# Patient Record
Sex: Male | Born: 1940 | Race: Black or African American | Hispanic: No | Smoking: Never smoker
Health system: Southern US, Community
[De-identification: ages and names within clinical notes are randomized; demographics above are authoritative.]

## PROBLEM LIST (undated history)

## (undated) DIAGNOSIS — R7611 Nonspecific reaction to tuberculin skin test without active tuberculosis: Secondary | ICD-10-CM

## (undated) DIAGNOSIS — E785 Hyperlipidemia, unspecified: Secondary | ICD-10-CM

## (undated) DIAGNOSIS — I10 Essential (primary) hypertension: Secondary | ICD-10-CM

## (undated) HISTORY — DX: Essential (primary) hypertension: I10

## (undated) HISTORY — DX: Hyperlipidemia, unspecified: E78.5

## (undated) HISTORY — DX: Nonspecific reaction to tuberculin skin test without active tuberculosis: R76.11

---

## 2004-06-15 ENCOUNTER — Ambulatory Visit: Payer: Self-pay | Admitting: Internal Medicine

## 2004-06-15 ENCOUNTER — Encounter (INDEPENDENT_AMBULATORY_CARE_PROVIDER_SITE_OTHER): Payer: Self-pay | Admitting: Internal Medicine

## 2004-06-18 ENCOUNTER — Ambulatory Visit (HOSPITAL_COMMUNITY): Admission: RE | Admit: 2004-06-18 | Discharge: 2004-06-18 | Payer: Self-pay | Admitting: Internal Medicine

## 2004-06-18 ENCOUNTER — Ambulatory Visit: Payer: Self-pay | Admitting: Internal Medicine

## 2005-10-01 ENCOUNTER — Ambulatory Visit: Payer: Self-pay | Admitting: Hospitalist

## 2005-10-01 ENCOUNTER — Ambulatory Visit (HOSPITAL_COMMUNITY): Admission: RE | Admit: 2005-10-01 | Discharge: 2005-10-01 | Payer: Self-pay | Admitting: Hospitalist

## 2005-10-04 ENCOUNTER — Ambulatory Visit: Payer: Self-pay | Admitting: Internal Medicine

## 2005-10-08 DIAGNOSIS — M109 Gout, unspecified: Secondary | ICD-10-CM | POA: Insufficient documentation

## 2005-10-11 ENCOUNTER — Ambulatory Visit: Payer: Self-pay | Admitting: Internal Medicine

## 2005-12-16 ENCOUNTER — Ambulatory Visit (HOSPITAL_COMMUNITY): Admission: RE | Admit: 2005-12-16 | Discharge: 2005-12-16 | Payer: Self-pay | Admitting: Internal Medicine

## 2005-12-16 ENCOUNTER — Ambulatory Visit: Payer: Self-pay | Admitting: Internal Medicine

## 2005-12-17 ENCOUNTER — Ambulatory Visit: Payer: Self-pay | Admitting: Internal Medicine

## 2006-01-08 ENCOUNTER — Ambulatory Visit: Payer: Self-pay | Admitting: Internal Medicine

## 2006-07-22 DIAGNOSIS — E785 Hyperlipidemia, unspecified: Secondary | ICD-10-CM | POA: Insufficient documentation

## 2006-07-22 DIAGNOSIS — I10 Essential (primary) hypertension: Secondary | ICD-10-CM | POA: Insufficient documentation

## 2006-11-18 ENCOUNTER — Telehealth (INDEPENDENT_AMBULATORY_CARE_PROVIDER_SITE_OTHER): Payer: Self-pay | Admitting: *Deleted

## 2006-11-25 ENCOUNTER — Telehealth (INDEPENDENT_AMBULATORY_CARE_PROVIDER_SITE_OTHER): Payer: Self-pay | Admitting: *Deleted

## 2008-03-16 ENCOUNTER — Encounter: Payer: Self-pay | Admitting: Internal Medicine

## 2008-03-16 ENCOUNTER — Ambulatory Visit: Payer: Self-pay | Admitting: Internal Medicine

## 2008-03-17 LAB — CONVERTED CEMR LAB
ALT: 26 units/L (ref 0–53)
AST: 22 units/L (ref 0–37)
BUN: 19 mg/dL (ref 6–23)
CO2: 27 meq/L (ref 19–32)
Calcium: 9.4 mg/dL (ref 8.4–10.5)
Chloride: 105 meq/L (ref 96–112)
Cholesterol: 239 mg/dL — ABNORMAL HIGH (ref 0–200)
Glucose, Bld: 93 mg/dL (ref 70–99)
LDL Cholesterol: 161 mg/dL — ABNORMAL HIGH (ref 0–99)
Potassium: 4.9 meq/L (ref 3.5–5.3)
Total Bilirubin: 0.4 mg/dL (ref 0.3–1.2)
Total Protein: 7.6 g/dL (ref 6.0–8.3)
VLDL: 32 mg/dL (ref 0–40)

## 2008-03-21 ENCOUNTER — Telehealth: Payer: Self-pay | Admitting: *Deleted

## 2009-03-21 ENCOUNTER — Telehealth: Payer: Self-pay | Admitting: Internal Medicine

## 2009-03-23 ENCOUNTER — Telehealth: Payer: Self-pay | Admitting: *Deleted

## 2009-06-02 ENCOUNTER — Telehealth: Payer: Self-pay | Admitting: Infectious Diseases

## 2009-06-02 ENCOUNTER — Encounter: Payer: Self-pay | Admitting: Internal Medicine

## 2009-06-02 ENCOUNTER — Ambulatory Visit: Payer: Self-pay | Admitting: Infectious Diseases

## 2009-06-05 LAB — CONVERTED CEMR LAB
ALT: 23 units/L (ref 0–53)
Alkaline Phosphatase: 59 units/L (ref 39–117)
BUN: 17 mg/dL (ref 6–23)
CO2: 26 meq/L (ref 19–32)
Chloride: 103 meq/L (ref 96–112)
Glucose, Bld: 88 mg/dL (ref 70–99)
LDL Cholesterol: 127 mg/dL — ABNORMAL HIGH (ref 0–99)
MCHC: 32.5 g/dL (ref 30.0–36.0)
MCV: 88.9 fL (ref >=78.0)
Platelets: 196 10*3/uL (ref 150–400)
Potassium: 4.3 meq/L (ref 3.5–5.3)
RBC: 4.6 M/uL (ref 4.22–5.81)
RDW: 13.4 % (ref 11.5–15.5)
Total Bilirubin: 0.4 mg/dL (ref 0.3–1.2)
Total CHOL/HDL Ratio: 4.6
Total Protein: 7.3 g/dL (ref 6.0–8.3)
WBC: 6.5 10*3/uL (ref 4.0–10.5)

## 2010-07-17 ENCOUNTER — Telehealth: Payer: Self-pay | Admitting: Internal Medicine

## 2010-09-05 ENCOUNTER — Ambulatory Visit: Payer: Self-pay

## 2010-10-24 NOTE — Progress Notes (Signed)
Summary: phone/gg  Phone Note Call from Patient   Summary of Call: Pt came to clinic stating his boss said pt was sleeping while at work.  Pt is 68 and wants to get off nights and wants Dr Onalee Hua to write a letter saying it's better for his health to work days. Pt has scheduled appointment with Dr Onalee Hua on 12/14.  Pt wanted the letter now so he could hand it in tomorrow.  I told pt he should be examined first so the doctor could evaluated him and make a decision.   He will tell his employer about the appointment. Initial call taken by: Merrie Roof RN,  July 17, 2010 3:52 PM  Follow-up for Phone Call        I agree with him being seen.  Thanks Follow-up by: Mariea Stable MD,  July 18, 2010 8:45 AM

## 2010-11-01 ENCOUNTER — Encounter: Payer: Self-pay | Admitting: Internal Medicine

## 2010-11-01 ENCOUNTER — Encounter: Payer: Self-pay | Admitting: *Deleted

## 2010-11-01 ENCOUNTER — Ambulatory Visit (INDEPENDENT_AMBULATORY_CARE_PROVIDER_SITE_OTHER): Payer: Medicare Other | Admitting: Internal Medicine

## 2010-11-01 VITALS — BP 150/78 | HR 74 | Temp 99.5°F | Ht 68.0 in | Wt 203.0 lb

## 2010-11-01 DIAGNOSIS — Z23 Encounter for immunization: Secondary | ICD-10-CM

## 2010-11-01 DIAGNOSIS — I10 Essential (primary) hypertension: Secondary | ICD-10-CM

## 2010-11-01 DIAGNOSIS — W5501XA Bitten by cat, initial encounter: Secondary | ICD-10-CM | POA: Insufficient documentation

## 2010-11-01 DIAGNOSIS — IMO0001 Reserved for inherently not codable concepts without codable children: Secondary | ICD-10-CM

## 2010-11-01 MED ORDER — AMOXICILLIN-POT CLAVULANATE 875-125 MG PO TABS: 1.0000 | ORAL_TABLET | Freq: Two times a day (BID) | ORAL | Status: AC

## 2010-11-01 MED ORDER — HYDROCHLOROTHIAZIDE 25 MG PO TABS: 25.0000 mg | ORAL_TABLET | Freq: Every day | ORAL | Status: DC

## 2010-11-01 MED ORDER — DIPHTH-ACELL PERTUSSIS-TETANUS 15-10-5 LF-MCG/0.5 IM SUSP: 0.5000 mL | Freq: Once | INTRAMUSCULAR | Status: DC

## 2010-11-01 NOTE — Progress Notes (Signed)
  Subjective:    Patient ID: Austin Huerta, male    DOB: 1941/05/05, 70 y.o.   MRN: [id number]  HPI Pt is a 70 y/o male with h/o HTN, gout (stable), and hyperlipidemia presenting with a one day h/o cat bite. States he was bitten by a Dance movement psychotherapist that belongs to his neighbor last night. He was trying to separate 2 cats from fighting at the time. He was bitten on his right ring finger, tried to treat with Iodine and sealed the wounds with a band aid. However developed significant swelling, redness and mild pain overnight. Denied any interval fever, chills, sob, or night sweats. There also has not been any drainage from the wounds. He does not remember when he got his last tetanus shot. He states that the cat has been immunized against rabies.    Review of Systems    As per HPI above.   Past Medical History  Diagnosis Date  . Hypertension   . Hyperlipidemia   . Gout   . Positive PPD     History of     Objective:   Physical Exam  Constitutional: He is oriented to person, place, and time. He appears well-developed and well-nourished. No distress.  Cardiovascular: Normal rate, regular rhythm and normal heart sounds.  Exam reveals no gallop and no friction rub.   No murmur heard. Pulmonary/Chest: Effort normal and breath sounds normal. No respiratory distress. He has no wheezes. He has no rales.  Abdominal: Soft. Bowel sounds are normal. There is no tenderness.  Musculoskeletal:       Hands:      Right hand - ROM only mildly limited by mild pain and swelling throughout wrist and finger joints.  Neurological: He is alert and oriented to person, place, and time.  Psychiatric: He has a normal mood and affect.      Assessment & Plan:

## 2010-11-01 NOTE — Progress Notes (Signed)
Addended by: Angelina Ok on: 11/01/2010 02:08 PM   Modules accepted: Orders

## 2010-11-01 NOTE — Patient Instructions (Signed)
Please take your antibiotic consistently for the next 7 days. If your symptoms worsen in the next 1-2 days, please go to your nearest ER. Go to your nearest ER if you develop any fever to 101/higher, chills, night sweats or worsening pain. Call the clinic if you have any questions or concerns.     Animal Bites   Animal bites have a high rate of infection, so they require thorough cleaning. Animal bites may become infected in spite of good cleaning. The teeth carry germs (bacteria) below the skin surface. Bites may require tetanus and rabies vaccines. The animal should be confined if possible for ten days to make sure there is no development of rabies. Medications which kill germs (antibiotics) may be required.   YOU MIGHT NEED A TETANUS SHOT NOW IF:  You have no idea when you had the last one.  You have never had a tetanus shot before.  Your wound had dirt in it.  If you need a tetanus shot, and you choose not to get one, there is a rare chance of getting tetanus. Sickness from tetanus can be serious.    HOME CARE INSTRUCTIONS  You may need to report the event to animal control or your local police.  Keep wound clean, dry and dressed as suggested by your caregiver.  Keep the injured part elevated as much as possible.  Do not resume use of the affected area until directed.  If antibiotics were prescribed, take them as directed and finish the prescription.  Only take over-the-counter or prescription medicines for pain, discomfort, or fever as directed by your caregiver.    SEEK MEDICAL CARE IF:  You develop a fever above 101 F (38.9 C).   The wound becomes more red and sore (inflamed) and swollen, or has a pus-like (purulent) discharge.  Your pain is not controlled with medication.   SEEK IMMEDIATE MEDICAL CARE IF:  You develop nausea or vomiting.  You have chills or a high temperature.  If pain prevents joint from movement.   MAKE SURE YOU:   Understand these  instructions.   Will watch your condition.  Will get help right away if you are not doing well or get worse.   Document Released: 09/09/2005  Document Re-Released: 10/01/2009 [facility] Patient Information 2011 [place], [place].

## 2010-11-01 NOTE — Progress Notes (Unsigned)
Pt walked into clinic with c/o cat bite to rt finger yesterday.  Today entire hand is swollen and red. Denies fever.  Taking advil for pain.    Talked with Dr Aundria Rud and pt added to schedule for today.

## 2010-11-01 NOTE — Assessment & Plan Note (Addendum)
Not optimally controlled. On HCTZ 25mg  daily. Hasn't been to the clinic in over a year. Has appt. with PCP in April, at which time med doses can be adjusted and all his chronic issues can be addressed. For today, refill script.

## 2010-11-01 NOTE — Assessment & Plan Note (Signed)
Has 2 puncture-like wounds on right ring finger with surrounding edema and erythema. Deeper infection not suspected at this time. ROM limited by swelling, no paresthesia. Afebrile as well. Will rx with Augmentin x 7 days. Tetanus shot given (unknown immunization status). Instructed to keep the wounds clean, dry and aerated.  To return in the next 4 days for reassessment, but instructed to report any worsening pain, swelling, development of fever or chills to the nearest ER.

## 2010-11-01 NOTE — Progress Notes (Signed)
Addended by: Angelina Ok on: 11/01/2010 01:53 PM   Modules accepted: Orders

## 2010-11-05 ENCOUNTER — Ambulatory Visit (INDEPENDENT_AMBULATORY_CARE_PROVIDER_SITE_OTHER): Payer: Medicare Other | Admitting: Ophthalmology

## 2010-11-05 ENCOUNTER — Encounter: Payer: Self-pay | Admitting: Ophthalmology

## 2010-11-05 DIAGNOSIS — I10 Essential (primary) hypertension: Secondary | ICD-10-CM

## 2010-11-05 DIAGNOSIS — IMO0001 Reserved for inherently not codable concepts without codable children: Secondary | ICD-10-CM

## 2010-11-05 DIAGNOSIS — W5501XA Bitten by cat, initial encounter: Secondary | ICD-10-CM

## 2010-11-05 NOTE — Assessment & Plan Note (Addendum)
The patient's blood pressure was elevated today, he is currently on 25 mg of HCTZ but he has not been taking this regularly. I encouraged the patient to take his blood pressure medication daily so that we can decide whether or not another agent will be necessary. If the patient's blood pressure is still elevated at his next visit, I would at least consider addition of a another medication though it may be appropriate to continue him on his current  regimen until he follows up with his PCP in April.

## 2010-11-05 NOTE — Progress Notes (Signed)
  Subjective:    Patient ID: Austin Huerta, male    DOB: Feb 14, 1941, 70 y.o.   MRN: [id number]  HPI   This is a 70 year old male with a past medical history significant for hypertension who was last seen on November 01, 2010 due to a one-day history of cat bite on the 4th finger of his right hand.  The bite did have surrounding erythema and swelling at that time leading to mildly decreased range of motion. Since the patient had no fever or systemic symptoms a deep wound infection was not felt to be likely.   Because of his unknown status, a tetanus vaccination was given, and the patient was treated with 7 days of Augmentin. Since that time,  the patient's redness has decreased, though he has had increased edema of the hand to a level of approximately 1-1/2 inches beyond the wrist. The patient does feel that this is starting to improve somewhat  and was at its worst on Saturday. The patient has not kept his hand elevated and also states that he divided his dose of augmentin in half for the past day. The patient denies any fevers chills or any constitutional symptoms. The patient also states that the pain in the hand is improving.   Review of Systems  Constitutional: Negative for fever and chills.  Respiratory: Negative for cough and shortness of breath.   Cardiovascular: Negative for chest pain and palpitations.  Gastrointestinal: Negative for vomiting, diarrhea and constipation.       Objective:   Physical Exam  Constitutional: He appears well-developed and well-nourished.  HENT:  Head: Normocephalic and atraumatic.  Eyes: Pupils are equal, round, and reactive to light.  Cardiovascular: Normal rate, regular rhythm and intact distal pulses.  Exam reveals no gallop and no friction rub.   No murmur heard. Pulmonary/Chest: Effort normal and breath sounds normal. He has no wheezes. He has no rales.  Abdominal: Soft. Bowel sounds are normal. He exhibits no distension. There is no tenderness.    Musculoskeletal: Normal range of motion.       Right hand has 2+ edema to a level slightly beyond the wrist.  The two puncture wounds are CDI without surrounding edema. The pt has 2+ radial puleses, and no lymphangitis.  There is mild tenderness over the tendons of the wrist, but this is improved per patient report. There is no lymphadenopathy in the right arm.  Neurological: He is alert. No cranial nerve deficit.  Skin: No rash noted.       Current Outpatient Prescriptions on File Prior to Visit  Medication Sig Dispense Refill  . amoxicillin-clavulanate (AUGMENTIN) 875-125 MG per tablet Take 1 tablet by mouth 2 (two) times daily.  14 tablet  0  . atorvastatin (LIPITOR) 20 MG tablet Take 20 mg by mouth daily.        . hydrochlorothiazide 25 MG tablet Take 1 tablet (25 mg total) by mouth daily.  60 tablet  3     Past Medical History  Diagnosis Date  . Hypertension   . Hyperlipidemia   . Gout   . Positive PPD     History of      Assessment & Plan:

## 2010-11-05 NOTE — Assessment & Plan Note (Addendum)
At this point in time, the patient hand does not appear to be infected it's  Very edematous. There is no lymphangitis and no significant temperature difference between the 2 hands. At this point in time, I will continue the patient on his course of antibiotics with the plan to have him followup in 3 days to ensure improvement in his symptoms. I instructed the patient to perform daily hand soaks, to continue working on range of motion, and to maintain an elevated position of the hand try improve the edema. If the patient does not have significant improvement in his symptoms in 3 days, I would recommend the addition of doxycycline to his antibiotic regimen, to cover  Secondary infection with MRSA. Referral to hand specialist should also be considered at that time if necessary.  I did tell the patient it is very important that he take full tablets twice daily.

## 2010-11-05 NOTE — Patient Instructions (Addendum)
Please call the clinic or go to the ED if you have any worsening of your hand symptoms , or develop any fevers chills nausea or vomiting or other concerning symptoms. You will need to followup here in the clinic in 2-3 days to ensure improvement in your symptoms.

## 2010-11-08 ENCOUNTER — Encounter: Payer: Self-pay | Admitting: Internal Medicine

## 2010-11-08 ENCOUNTER — Ambulatory Visit (INDEPENDENT_AMBULATORY_CARE_PROVIDER_SITE_OTHER): Payer: Medicare Other | Admitting: Internal Medicine

## 2010-11-08 VITALS — BP 137/75 | HR 78 | Temp 97.4°F | Ht 68.0 in | Wt 194.2 lb

## 2010-11-08 DIAGNOSIS — W5501XA Bitten by cat, initial encounter: Secondary | ICD-10-CM

## 2010-11-08 DIAGNOSIS — I10 Essential (primary) hypertension: Secondary | ICD-10-CM

## 2010-11-08 DIAGNOSIS — IMO0001 Reserved for inherently not codable concepts without codable children: Secondary | ICD-10-CM

## 2010-11-08 NOTE — Assessment & Plan Note (Signed)
Edema and swelling improved, continue abx, follow up if swelling resumes or worsens Shown gentle flexion exercises for hand to prevent contracture

## 2010-11-08 NOTE — Progress Notes (Signed)
  Subjective:    Patient ID: Austin Huerta, male    DOB: 03/19/1941, 70 y.o.   MRN: [id number]  HPI Wound check Has been taking Abx without difficulty   Review of Systems Neg    Objective:   Physical Exam    Hand swelling improved.  He has some discomfort with flexion of hand and fingers but able to do     Assessment & Plan:

## 2010-11-09 LAB — BASIC METABOLIC PANEL
BUN: 25 mg/dL — ABNORMAL HIGH (ref 6–23)
CO2: 27 mEq/L (ref 19–32)
Calcium: 9.1 mg/dL (ref 8.4–10.5)
Chloride: 99 mEq/L (ref 96–112)
Creat: 1.08 mg/dL (ref 0.40–1.50)
Glucose, Bld: 112 mg/dL — ABNORMAL HIGH (ref 70–99)
Potassium: 4.1 mEq/L (ref 3.5–5.3)
Sodium: 137 mEq/L (ref 135–145)

## 2011-01-09 ENCOUNTER — Encounter: Payer: Self-pay | Admitting: Internal Medicine

## 2011-03-20 ENCOUNTER — Ambulatory Visit: Payer: Medicare Other | Admitting: Internal Medicine

## 2011-03-25 ENCOUNTER — Ambulatory Visit: Payer: Medicare Other | Admitting: Internal Medicine

## 2011-04-29 ENCOUNTER — Telehealth: Payer: Self-pay | Admitting: *Deleted

## 2011-04-29 ENCOUNTER — Encounter: Payer: Self-pay | Admitting: Internal Medicine

## 2011-04-29 ENCOUNTER — Ambulatory Visit (HOSPITAL_COMMUNITY)
Admission: RE | Admit: 2011-04-29 | Discharge: 2011-04-29 | Disposition: A | Discharge status: Home / Self Care | Payer: Medicare Other | Source: Ambulatory Visit | Attending: Internal Medicine | Admitting: Internal Medicine

## 2011-04-29 ENCOUNTER — Ambulatory Visit (INDEPENDENT_AMBULATORY_CARE_PROVIDER_SITE_OTHER): Payer: Medicare Other | Admitting: Internal Medicine

## 2011-04-29 VITALS — BP 146/82 | HR 96 | Temp 98.0°F | Ht 68.0 in | Wt 204.1 lb

## 2011-04-29 DIAGNOSIS — M25569 Pain in unspecified knee: Secondary | ICD-10-CM

## 2011-04-29 DIAGNOSIS — E785 Hyperlipidemia, unspecified: Secondary | ICD-10-CM

## 2011-04-29 DIAGNOSIS — I10 Essential (primary) hypertension: Secondary | ICD-10-CM

## 2011-04-29 DIAGNOSIS — M25561 Pain in right knee: Secondary | ICD-10-CM

## 2011-04-29 DIAGNOSIS — I2510 Atherosclerotic heart disease of native coronary artery without angina pectoris: Secondary | ICD-10-CM

## 2011-04-29 DIAGNOSIS — M25469 Effusion, unspecified knee: Secondary | ICD-10-CM | POA: Insufficient documentation

## 2011-04-29 LAB — BASIC METABOLIC PANEL
CO2: 29 mEq/L (ref 19–32)
Potassium: 4 mEq/L (ref 3.5–5.3)

## 2011-04-29 LAB — URIC ACID: Uric Acid, Serum: 7 mg/dL (ref 4.0–7.8)

## 2011-04-29 MED ORDER — PRAVASTATIN SODIUM 40 MG PO TABS: 40.0000 mg | ORAL_TABLET | Freq: Every evening | ORAL | Status: DC

## 2011-04-29 MED ORDER — HYDROCHLOROTHIAZIDE 25 MG PO TABS: 25.0000 mg | ORAL_TABLET | Freq: Every day | ORAL | Status: DC

## 2011-04-29 MED ORDER — ASPIRIN EC 81 MG PO TBEC: 81.0000 mg | DELAYED_RELEASE_TABLET | Freq: Every day | ORAL | Status: DC

## 2011-04-29 MED ORDER — PREDNISONE (PAK) 10 MG PO TABS: 10.0000 mg | ORAL_TABLET | Freq: Every day | ORAL | Status: AC

## 2011-04-29 NOTE — Assessment & Plan Note (Signed)
Patient was supposed to be on Lipitor however he has not been taking anything for his cholesterol recently, I started him on pravastatin after discussion about his cholesterol, and she agrees with this plan, at next followup patient should have a repeat fasting lipid panel along with liver function tests.

## 2011-04-29 NOTE — Progress Notes (Signed)
  Subjective:    Patient ID: Austin Huerta, male    DOB: November 13, 1940, 70 y.o.   MRN: [id number]  HPI  She is a 70 year old male, with a past medical history significant for gout which patient reports was diagnosed here 5 years ago via an right MTP joint tap. Patient reports it has been helping some friends and neighbors move furniture recently, does not recall any injury however now he is experiencing right knee swelling and pain for the past few days. Patient reports that this is similar to episodes of gout that he had in the past, denies fever chills nausea vomiting, or any other complaints. Reports that he is noncompliant with his medications. He only takes hydrochlorothiazide when he remembers.   Review of Systems  Musculoskeletal: Positive for joint swelling.  All other systems reviewed and are negative.       Objective:   Physical Exam  Constitutional: He is oriented to person, place, and time. He appears well-developed and well-nourished.  HENT:  Head: Normocephalic and atraumatic.  Eyes: Pupils are equal, round, and reactive to light.  Neck: Normal range of motion. No JVD present. No thyromegaly present.  Cardiovascular: Normal rate, regular rhythm and normal heart sounds.   Pulmonary/Chest: Effort normal and breath sounds normal. He has no wheezes. He has no rales.  Abdominal: Soft. Bowel sounds are normal. There is no tenderness. There is no rebound.  Musculoskeletal: Normal range of motion. He exhibits no edema.       Right knee: He exhibits swelling. He exhibits no effusion, no deformity, no laceration, no erythema, normal alignment, no LCL laxity, normal patellar mobility, normal meniscus and no MCL laxity. tenderness found. No MCL and no LCL tenderness noted.       Legs: Neurological: He is alert and oriented to person, place, and time.  Skin: Skin is warm and dry.          Assessment & Plan:

## 2011-04-29 NOTE — Assessment & Plan Note (Signed)
Patient is asymptomatic however given the patient's age and risk factors, his pretest probability for CAD is 99%, given this I will start him on a baby aspirin daily

## 2011-04-29 NOTE — Assessment & Plan Note (Signed)
Patient's blood pressure slightly elevated, however he has not been taking his medication as prescribed, I have given him a prescription for hydrochlorothiazide today, I stressed the importance of medication compliance.

## 2011-04-29 NOTE — Telephone Encounter (Signed)
Pt called with c/o knee pain. Onset 1 week ago.  He moved furniture, did lifting for several days. Now muscles on rt knee got stiff and it resolved into swelling.  Hard to stand, using cane to ambulate.  He has taken IBU 600 mg a day with some relief. Hx: gout   Will see today

## 2011-04-29 NOTE — Assessment & Plan Note (Signed)
Given the patient's history of recent exertion and heavy lifting while moving furniture, this may be traumatic, but the patient does report a history of gout which may have contributed to his right knee pain. I will order a two-view x-ray of his knee today and check a uric acid level and BMET, for now advised patient to take nonsteroidal anti-inflammatories short-term for relief. If the pain and swelling does not subside, I advised the patient to return for possible joint aspiration for further evaluation and treatment.

## 2011-05-13 ENCOUNTER — Ambulatory Visit (INDEPENDENT_AMBULATORY_CARE_PROVIDER_SITE_OTHER): Payer: Medicare Other | Admitting: Internal Medicine

## 2011-05-13 ENCOUNTER — Encounter: Payer: Self-pay | Admitting: Internal Medicine

## 2011-05-13 DIAGNOSIS — M25561 Pain in right knee: Secondary | ICD-10-CM

## 2011-05-13 DIAGNOSIS — R7611 Nonspecific reaction to tuberculin skin test without active tuberculosis: Secondary | ICD-10-CM

## 2011-05-13 DIAGNOSIS — I2510 Atherosclerotic heart disease of native coronary artery without angina pectoris: Secondary | ICD-10-CM

## 2011-05-13 DIAGNOSIS — E785 Hyperlipidemia, unspecified: Secondary | ICD-10-CM

## 2011-05-13 DIAGNOSIS — M25569 Pain in unspecified knee: Secondary | ICD-10-CM

## 2011-05-13 DIAGNOSIS — I10 Essential (primary) hypertension: Secondary | ICD-10-CM

## 2011-05-13 LAB — LIPID PANEL
HDL: 44 mg/dL (ref >39)
LDL Cholesterol: 116 mg/dL — ABNORMAL HIGH (ref 0–99)
Total CHOL/HDL Ratio: 4.2 Ratio
Triglycerides: 120 mg/dL (ref <150)

## 2011-05-13 MED ORDER — ASPIRIN EC 81 MG PO TBEC: 81.0000 mg | DELAYED_RELEASE_TABLET | Freq: Every day | ORAL | Status: DC

## 2011-05-13 MED ORDER — PRAVASTATIN SODIUM 40 MG PO TABS: 40.0000 mg | ORAL_TABLET | Freq: Every evening | ORAL | Status: DC

## 2011-05-13 NOTE — Assessment & Plan Note (Signed)
Overuse/trauma vs. Gout.  No signs of active inflammation today.  No pain.   Foot swelling likely dependent swelling from the knee.  Stop ibuprofen- Take one 200mg  pill TID for two-three days then stop. Stop using crutches RTC if pain returns or swelling worsens.

## 2011-05-13 NOTE — Progress Notes (Signed)
  Subjective:    Patient ID: Austin Huerta, male    DOB: Feb 08, 1941, 70 y.o.   MRN: [id number]  HPI Austin Huerta is a 70 year old man with a history of HTN, HLD, and Gout who presents for follow-up after being seen by Dr. Gilford Rile on 04/29/11 for R knee pain.   His knee pain was thought to be either related to trauma/overuse since he had been lifting boxes for three days beforehand (but no traumatic event in particular) or gout (history of gout).  He was advised to take 600mg  of ibuprofen TID, which he is still taking now.  He also has been using crutches, which he is still using even though the pain resolved a week ago after lasting a week.  He believes that the swelling remains, perhaps a bit better than at last visit.    He has HTN and was treated with HCTZ.  He says that he has been taking this regularly without missing doses. No headaches, CP, SOB.  He has a history of gout with only one definite flare five years ago when he had pain the same knee and a arthrocentesis showing gout.    At baseline, he can walk at least a mile and can climb at least 3-4 flights of stairs.   He does not exercise regularly. No smoking, no drug use.  No alcohol use.  Review of Systems ROS: General: no fevers, chills, changes in weight, changes in appetite Skin: no rash HEENT: no blurry vision, hearing changes, sore throat Pulm: no dyspnea, coughing, wheezing CV: no chest pain, palpitations, shortness of breath Abd: no abdominal pain, nausea/vomiting, diarrhea/constipation GU: no dysuria, hematuria, polyuria Ext: see HPI Neuro: no weakness, numbness, or tingling.  No headaches.     Objective:  Physical Exam Filed Vitals:   05/13/11 1335  BP: 155/79  Pulse: 57  Temp: 97.1 F (36.2 C)   BP recheck: 164/78  General: alert, well-developed, and cooperative to examination.  Head: normocephalic and atraumatic.  Eyes: vision grossly intact, pupils equal, pupils round, pupils reactive to light, no injection  and anicteric.  Mouth: pharynx pink and moist, no erythema, and no exudates.  Neck: supple, full ROM, no JVD, and no carotid bruits.  Lungs: normal respiratory effort, no accessory muscle use, normal breath sounds, no crackles, and no wheezes. Heart: normal rate, regular rhythm, no murmur, no gallop, and no rub.  Msk: no joint swelling, no joint warmth, and no redness over joints.  Pulses: 2+ DP/PT pulses bilaterally  Extremities: No cyanosis, clubbing, edema.  R knee is slightly larger than L.  No tenderness to palpation in either knee. R knee: No tenderness to palpation, no pain with flexion/extension.  No pain with varus or valgus stress.  No baker's cyst palpation, no abnormal popliteal fullness.  Anterior drawer negative.  No palpation effusion.    R foot: Mild swelling over dorsum of foot into proximal toes.  Foot entirely non-tender to palpation.  Sensation intact in each toe.   Neurologic: alert & oriented X3, cranial nerves II-XII intact, strength normal in all extremities, sensation intact to light touch. Skin: turgor normal and no rashes.  Psych: Oriented X3, memory intact for recent and remote, normally interactive, good eye contact, not anxious appearing, and not depressed appearing.   Assessment & Plan:

## 2011-05-13 NOTE — Patient Instructions (Signed)
Please return to clinic in one month

## 2011-05-13 NOTE — Assessment & Plan Note (Addendum)
Never filled his pravastatin prescription because afraid of side effects.  Explained to him how we will monitor for LFTs and that he should stop taking if he notices significant muscle pain.  Otherwise reassured him.  Will get CMET and FLP today since he is fasting.  Will give him another script for pravastatin.    His Framingham score gives him 37% 10 year risk of CHD.  As a result, his LDL goal is 100.

## 2011-05-13 NOTE — Assessment & Plan Note (Signed)
Pressure high today, worse on recheck.  Reports that he has been taking his HCTZ regularly.  Asymptomatic.  This may be influenced by the high doses of ibuprofen he has been taking.  Will stop ibuprofen and follow-up in one month for re-evaluation.

## 2011-05-13 NOTE — Progress Notes (Deleted)
  Subjective:    Patient ID: Austin Huerta, male    DOB: 06/29/1941, 69 y.o.   MRN: [id number]  HPI    Review of Systems     Objective:   Physical Exam        Assessment & Plan:   

## 2011-05-14 LAB — COMPLETE METABOLIC PANEL WITH GFR
ALT: 15 U/L (ref 0–53)
Albumin: 3.8 g/dL (ref 3.5–5.2)
Alkaline Phosphatase: 63 U/L (ref 39–117)
CO2: 31 mEq/L (ref 19–32)
Calcium: 9.4 mg/dL (ref 8.4–10.5)
GFR, Est African American: >60 mL/min (ref >60)
Glucose, Bld: 87 mg/dL (ref 70–99)
Potassium: 4.1 mEq/L (ref 3.5–5.3)
Total Bilirubin: 0.3 mg/dL (ref 0.3–1.2)

## 2011-05-14 NOTE — Progress Notes (Signed)
i agree with assessment and plan as per Dr. Larey Seat

## 2011-05-20 ENCOUNTER — Encounter: Payer: Medicare Other | Admitting: Internal Medicine

## 2011-06-13 ENCOUNTER — Ambulatory Visit (INDEPENDENT_AMBULATORY_CARE_PROVIDER_SITE_OTHER): Payer: Medicare Other | Admitting: Internal Medicine

## 2011-06-13 ENCOUNTER — Encounter: Payer: Self-pay | Admitting: Internal Medicine

## 2011-06-13 VITALS — BP 160/81 | HR 71 | Temp 97.6°F | Ht 68.0 in | Wt 206.0 lb

## 2011-06-13 DIAGNOSIS — I10 Essential (primary) hypertension: Secondary | ICD-10-CM

## 2011-06-13 DIAGNOSIS — Z0000 Encounter for general adult medical examination without abnormal findings: Secondary | ICD-10-CM | POA: Insufficient documentation

## 2011-06-13 DIAGNOSIS — M109 Gout, unspecified: Secondary | ICD-10-CM

## 2011-06-13 DIAGNOSIS — E785 Hyperlipidemia, unspecified: Secondary | ICD-10-CM

## 2011-06-13 MED ORDER — LISINOPRIL-HYDROCHLOROTHIAZIDE 10-12.5 MG PO TABS: 1.0000 | ORAL_TABLET | Freq: Every day | ORAL | Status: DC

## 2011-06-13 NOTE — Assessment & Plan Note (Signed)
-  the following vaccines/interventions were discussed at length, but declined by the patient: Flu shot, pneumovax, colonoscopy, FOBT

## 2011-06-13 NOTE — Assessment & Plan Note (Signed)
The patient presents for a follow-up of R knee pain, likely caused by gout vs msk pain.  The patient's pain has resolved, with a resulting mild joint effusion, but he continues to use a crutch and ibuprofen.  The patient appears anxious during the interview, and I believe his continued crutch and NSAID use is likely due to anxiety, rather than to knee pain (which the patient admits has resolved) -the patient was encouraged to resume normal daily activities, including walking without a crutch -the patient was encouraged to stop using NSAID's, and was counseled on the potential deleterious effects of NSAID's on the GI and Renal systems

## 2011-06-13 NOTE — Progress Notes (Signed)
HPI The patient is a 70 yo man, history of HL, gout, and HTN, presenting for a 19-month follow-up for right knee pain.  The patient has a history of gout, with 1 prior episode 5 years ago, occurring in his right knee, diagnosed by arthrocentesis.  The patient presented 6 weeks ago with right knee pain, thought to be another episode of gout vs musculoskeletal pain after moving some heavy boxes.  He was seen again 4 weeks ago, and his pain had resolved with ibuprofen, and he was left with just some residual swelling, likely representing mild joint effusion.  At that time, he was advised to stop taking ibuprofen, and to stop using crutches.  Since then, the patient continues to report resolution of pain, still with persistent right knee swelling.  The patient has continued to take ibuprofen to "prevent the pain from returning".  He has also continued to use a crutch or a cane to ambulate, though he notes that he could walk all day without the aid, but that he prefers to use the crutch.  The patient was also started on Pravastatin at his last visit, but admits that he didn't fill the prescription until 7 days ago.  The patient also has a history of HTN, currently treated with only HCTZ.  His BP was elevated at his last visit, and this was thought to be possibly due to ibuprofen use.  ROS: General: no fevers, chills, changes in weight, changes in appetite Skin: no rash HEENT: no blurry vision, hearing changes, sore throat Pulm: no dyspnea, coughing, wheezing CV: no chest pain, palpitations, shortness of breath Abd: no abdominal pain, nausea/vomiting, diarrhea/constipation GU: no dysuria, hematuria, polyuria Ext: no arthralgias, myalgias Neuro: no weakness, numbness, or tingling  Filed Vitals:   06/13/11 1041  BP: 160/81  Pulse: 71  Temp: 97.6 F (36.4 C)    PEX General: alert, cooperative, and in no apparent distress HEENT: pupils equal round and reactive to light, vision grossly intact,  oropharynx clear and non-erythematous  Neck: supple, no lymphadenopathy, JVD, or carotid bruits Lungs: clear to ascultation bilaterally, normal work of respiration, no wheezes, rales, ronchi Heart: regular rate and rhythm, no murmurs, gallops, or rubs Abdomen: soft, non-tender, non-distended, normal bowel sounds Msk: Right knee with mild joint effusion present, non-painful to palpation, non-tender upon tests of ACL, PCL, menisci, and lateral ligaments, no warmth, full painless ROM Neurologic: alert & oriented X3, cranial nerves II-XII intact, strength grossly intact, sensation intact to light touch  Assessment/Plan

## 2011-06-13 NOTE — Assessment & Plan Note (Signed)
The patient has a history of hypertension, and review of records reveals poor control over multiple occasions over the past few years.  The patient is adverse to taking medications.  However, after a long conversation, he agreed to try a combination BP pill, since it would not increase the number of pills he has to take per day -patient was switched from HCTZ 25 to Lisinopril-HCTZ 10-12.5 -he will need close follow-up to ensure he has actually started taking this medication, since he has a history of medication non-compliance -the importance of BP control, and the potential complications of uncontrolled hypertension, were discussed at length with the patient -follow-up in 4-6 weeks

## 2011-06-13 NOTE — Patient Instructions (Signed)
For your blood pressure, stop taking the Hydrochlorothiazide (HCTZ) medication.  Instead, we are starting you on a combination medication called Lisinopril-Hydrochlorothiazide, which is a combination of 2 blood pressure medications in 1 pill.  Take 1 tablet daily.  For your knee, your episode of gout has resolved, and what you are experiencing is normal joint effects that happen after an episode of gout.  The best way to improve your knee is to return to your normal activities, and stop using a crutch or cane. -It is important that you also stop taking ibuprofen or similar medications.  These medications can have significant side effects if taken for long periods of time, including stomach ulcers and kidney damage.  It's okay to use these medications for short periods of time around gout flares, but not for long periods of time.  Please return for a follow-up appointment in 4-6 weeks.

## 2011-06-13 NOTE — Progress Notes (Signed)
I agree with Dr. Brown's assessment and plan. 

## 2011-06-13 NOTE — Assessment & Plan Note (Signed)
The patient was re-prescribed pravastatin at his last visit, after not filling a prior prescription for this medication.  The patient recently filled this prescription 1 week ago. -continue this medication -check cmp at next visit

## 2011-07-12 ENCOUNTER — Inpatient Hospital Stay (INDEPENDENT_AMBULATORY_CARE_PROVIDER_SITE_OTHER)
Admission: RE | Admit: 2011-07-12 | Discharge: 2011-07-12 | Disposition: A | Discharge status: Home / Self Care | Payer: Medicare Other | Source: Ambulatory Visit | Attending: Emergency Medicine | Admitting: Emergency Medicine

## 2011-07-12 DIAGNOSIS — M25469 Effusion, unspecified knee: Secondary | ICD-10-CM

## 2011-07-12 LAB — URIC ACID: Uric Acid, Serum: 7.6 mg/dL (ref 4.0–7.8)

## 2011-07-15 ENCOUNTER — Ambulatory Visit (INDEPENDENT_AMBULATORY_CARE_PROVIDER_SITE_OTHER): Payer: Medicare Other | Admitting: Internal Medicine

## 2011-07-15 ENCOUNTER — Encounter: Payer: Self-pay | Admitting: Internal Medicine

## 2011-07-15 VITALS — BP 140/78 | HR 72 | Temp 97.8°F | Ht 68.0 in | Wt 207.3 lb

## 2011-07-15 DIAGNOSIS — I10 Essential (primary) hypertension: Secondary | ICD-10-CM

## 2011-07-15 MED ORDER — ATENOLOL 25 MG PO TABS: 25.0000 mg | ORAL_TABLET | Freq: Every day | ORAL | Status: DC

## 2011-07-15 NOTE — Progress Notes (Signed)
HPI: 1. Follow up on gout. Reports an increase in frequency of his "gout attacks.' usually alternates in his knees. Currently is asymptomatic. States that watches his diet and avoids gout-provoking foods/drinks. Was recently started on HCTZ.  Past Medical History  Diagnosis Date  . Hypertension   . Hyperlipidemia   . Gout   . Positive PPD     History of   Current Outpatient Prescriptions  Medication Sig Dispense Refill  . aspirin EC 81 MG tablet Take 1 tablet (81 mg total) by mouth daily.  150 tablet  2  . lisinopril-hydrochlorothiazide (PRINZIDE) 10-12.5 MG per tablet Take 1 tablet by mouth daily.  30 tablet  11  . pravastatin (PRAVACHOL) 40 MG tablet Take 1 tablet (40 mg total) by mouth every evening.  30 tablet  11   No family history on file. History   Social History  . Marital Status: Legally Separated    Spouse Name: N/A    Number of Children: N/A  . Years of Education: N/A   Social History Main Topics  . Smoking status: Never Smoker   . Smokeless tobacco: None  . Alcohol Use: Yes     occassionally  . Drug Use: None  . Sexually Active: None   Other Topics Concern  . 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  HEENT: Denies photophobia, eye pain, redness, hearing loss, ear pain, congestion, sore throat, rhinorrhea, sneezing, mouth sores, trouble swallowing, neck pain, neck stiffness and tinnitus.  Respiratory: Denies SOB, DOE, cough, chest tightness, and wheezing.  Cardiovascular: Denies chest pain, palpitations and leg swelling.  Gastrointestinal: Denies nausea, vomiting, abdominal pain, diarrhea, constipation, blood in stool and abdominal distention.  Genitourinary: Denies dysuria, urgency, frequency, hematuria, flank pain and difficulty urinating.  Musculoskeletal: Denies  myalgias, back pain, joint swelling, arthralgias and gait problem.  Skin: Denies pallor, rash and wound.  Neurological: Denies dizziness, seizures, syncope, weakness, light-headedness, numbness and headaches.  Hematological: Denies adenopathy. Easy bruising, personal or family bleeding history  Psychiatric/Behavioral: Denies suicidal ideation, mood changes, confusion, nervousness, sleep disturbance and agitation   Vitals: reviewed General: alert, well-developed, and cooperative to examination.  Head: normocephalic and atraumatic.  Eyes: vision grossly intact, pupils equal, pupils round, pupils reactive to light, no injection and anicteric.  Mouth: pharynx pink and moist, no erythema, and no exudates.  Neck: supple, full ROM, no thyromegaly, no JVD, and no carotid bruits.  Lungs: normal respiratory effort, no accessory muscle use, normal breath sounds, no crackles, and no wheezes. Heart: normal rate, regular rhythm, no murmur, no gallop, and no rub.  Abdomen: soft, non-tender, normal bowel sounds, no distention, no guarding, no rebound tenderness, no hepatomegaly, and no splenomegaly.  Msk: no joint swelling, no joint warmth, and no redness over joints.  Pulses: 2+ DP/PT pulses bilaterally Extremities: No cyanosis, clubbing, edema Neurologic: alert & oriented X3, cranial nerves II-XII intact, strength normal in all extremities, sensation intact to light touch, and gait normal.  Skin: turgor normal and no rashes.  Psych: Oriented X3, memory intact for recent and remote, normally interactive, good eye contact, not anxious appearing, and not depressed appearing.    Assessment & Plan: 1. Intermittent knee gout flares Most likely due to the start of a therapy with HCTZ. -will d/c zestoretic - will restart Atenolol (used to have good response per patient's report). RTC in  4 weeks.  2. Left knee pain 2/2 gout -start indomethacin 50 mg PO tid PRN

## 2011-07-15 NOTE — Patient Instructions (Addendum)
Please, stop taking Hydrocodone, Lisinopril/hydrochlorothiazide. You may continue to take indomethacin on as needed basis for gout pain. Please, make an appointment in 4 weeks or sooner; and call with any questions.

## 2011-07-15 NOTE — Progress Notes (Deleted)
  Subjective:    Patient ID: Austin Huerta, male    DOB: November 04, 1940, 70 y.o.   MRN: [id number]  HPI    Review of Systems     Objective:   Physical Exam        Assessment & Plan:

## 2011-07-18 ENCOUNTER — Encounter: Payer: Self-pay | Admitting: Internal Medicine

## 2011-08-02 ENCOUNTER — Telehealth (HOSPITAL_COMMUNITY): Payer: Self-pay | Admitting: *Deleted

## 2011-09-02 ENCOUNTER — Encounter: Payer: Self-pay | Admitting: Internal Medicine

## 2011-09-02 ENCOUNTER — Ambulatory Visit (INDEPENDENT_AMBULATORY_CARE_PROVIDER_SITE_OTHER): Payer: Medicare Other | Admitting: Internal Medicine

## 2011-09-02 VITALS — BP 178/99 | HR 67 | Temp 97.6°F | Ht 68.0 in | Wt 208.4 lb

## 2011-09-02 DIAGNOSIS — I10 Essential (primary) hypertension: Secondary | ICD-10-CM

## 2011-09-02 DIAGNOSIS — M109 Gout, unspecified: Secondary | ICD-10-CM

## 2011-09-02 DIAGNOSIS — Z0000 Encounter for general adult medical examination without abnormal findings: Secondary | ICD-10-CM

## 2011-09-02 MED ORDER — LISINOPRIL 20 MG PO TABS: 20.0000 mg | ORAL_TABLET | Freq: Every day | ORAL | Status: DC

## 2011-09-02 NOTE — Patient Instructions (Signed)
Please take your pravastatin and lisinopril as directed.    Please return to clinic in 3 months.

## 2011-09-09 NOTE — Assessment & Plan Note (Signed)
BPs seem to be too high on atenolol.  Will discontinue it and change to lisinopril which should be more effective as a single agent than a beta blocker.

## 2011-09-09 NOTE — Assessment & Plan Note (Signed)
Says he had flu shot at [employer].  Declined pneumovax and colonoscopy today, wants to rediscuss these in the future.

## 2011-09-09 NOTE — Assessment & Plan Note (Addendum)
Saw urgent care recently who treated him for gout in L knee.  Gave him indomethacin.  Suggested OTC ibuprofen but two tablets at once (400mg ) Q6HPRN for acute gout flairs.  Will hold off on maintenance therapy at this time since he has only had 2 definite episodes in 5 years and a third possible flair earlier this year.

## 2011-09-09 NOTE — Progress Notes (Signed)
  Subjective:   Patient ID: Austin Huerta male   DOB: 01-Jul-1941 70 y.o.   MRN: [id number]  HPI: Austin Huerta is a 70 y.o. with HTN, HLD, and Gout who comes for follow-up.  He says that he recently went to urgent care for L knee pain that was diagnosed as a gout flair.  This has since totally resolved for him.  He uses a cane to get off the bus (stepping down) but otherwise is walking ok, no longer using crutches as he was when he saw me earlier this year.    Has not been taking his pravastatin consistently.      Past Medical History  Diagnosis Date  . Hypertension   . Hyperlipidemia   . Gout   . Positive PPD     History of   Current Outpatient Prescriptions  Medication Sig Dispense Refill  . aspirin EC 81 MG tablet Take 1 tablet (81 mg total) by mouth daily.  150 tablet  2  . lisinopril (PRINIVIL,ZESTRIL) 20 MG tablet Take 1 tablet (20 mg total) by mouth daily.  30 tablet  6  . pravastatin (PRAVACHOL) 40 MG tablet Take 1 tablet (40 mg total) by mouth every evening.  30 tablet  11   No family history on file. History   Social History  . Marital Status: Legally Separated    Spouse Name: N/A    Number of Children: N/A  . Years of Education: N/A   Social History Main Topics  . Smoking status: Never Smoker   . Smokeless tobacco: None  . Alcohol Use: Yes     occassionally  . Drug Use: None  . Sexually Active: None   Other Topics Concern  . 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  Respiratory: Denies SOB, DOE, cough, chest tightness,  and wheezing.   Cardiovascular: Denies chest pain, palpitations and leg swelling.  Gastrointestinal: Denies nausea, vomiting, abdominal pain, diarrhea, constipation Genitourinary: Denies dysuria, urgency, frequency, hematuria   Objective:    Physical Exam: Filed Vitals:   09/02/11 1440  BP: 178/99  Pulse: 67  Temp: 97.6 F (36.4 C)  TempSrc: Oral  Height: 5\' 8"  (1.727 m)  Weight: 208 lb 6.4 oz (94.53 kg)   Constitutional: Vital signs reviewed.  Patient is a well-developed and well-nourished male in no acute distress and cooperative with exam. Alert and oriented x3.  Head: Normocephalic and atraumatic Mouth: no erythema or exudates, MMM Eyes: PERRL, EOMI, conjunctivae normal, No scleral icterus.  Neck: No JVD or carotid bruit present.  Cardiovascular: RRR, S1 normal, S2 normal, no MRG, pulses symmetric and intact bilaterally Pulmonary/Chest: CTAB, no wheezes, rales, or rhonchi  Musculoskeletal:   L knee: Full ROM, no tenderness, no swelling, no erythema.    Neurological: A&O x3, Strenght is normal and symmetric bilaterally, cranial nerve II-XII are grossly intact, no focal motor deficit  Assessment & Plan:

## 2011-09-10 NOTE — Progress Notes (Signed)
agree

## 2013-12-21 ENCOUNTER — Ambulatory Visit: Payer: Medicare Other | Admitting: Internal Medicine

## 2014-01-14 ENCOUNTER — Ambulatory Visit (INDEPENDENT_AMBULATORY_CARE_PROVIDER_SITE_OTHER): Payer: Medicare Other | Admitting: Internal Medicine

## 2014-01-14 ENCOUNTER — Encounter: Payer: Self-pay | Admitting: Internal Medicine

## 2014-01-14 VITALS — BP 203/95 | HR 55 | Temp 97.1°F | Ht 68.0 in | Wt 197.9 lb

## 2014-01-14 DIAGNOSIS — I10 Essential (primary) hypertension: Secondary | ICD-10-CM

## 2014-01-14 DIAGNOSIS — E785 Hyperlipidemia, unspecified: Secondary | ICD-10-CM | POA: Diagnosis not present

## 2014-01-14 DIAGNOSIS — Z0000 Encounter for general adult medical examination without abnormal findings: Secondary | ICD-10-CM

## 2014-01-14 DIAGNOSIS — B351 Tinea unguium: Secondary | ICD-10-CM

## 2014-01-14 MED ORDER — HYDROCHLOROTHIAZIDE 12.5 MG PO TABS: 12.5000 mg | ORAL_TABLET | Freq: Every day | ORAL | Status: DC

## 2014-01-14 MED ORDER — CICLOPIROX 0.77 % EX GEL: 1.0000 "application " | Freq: Two times a day (BID) | CUTANEOUS | Status: DC

## 2014-01-14 MED ORDER — LISINOPRIL 20 MG PO TABS: 20.0000 mg | ORAL_TABLET | Freq: Every day | ORAL | Status: DC

## 2014-01-14 MED ORDER — LISINOPRIL-HYDROCHLOROTHIAZIDE 20-12.5 MG PO TABS: 1.0000 | ORAL_TABLET | Freq: Every day | ORAL | Status: DC

## 2014-01-14 NOTE — Assessment & Plan Note (Signed)
Disc'ed colonoscopy and pneumonia vx today which patient will consider

## 2014-01-14 NOTE — Assessment & Plan Note (Signed)
Noted to left 1st and 2nd toenails and right foot first toe and less on 2nd toenail Pt declines oral tx though he was notified its more effective  Rx Ciclopirox gel qhs to bid then remove every 7 days with alcohol and repeat again for up to 48 weeks.

## 2014-01-14 NOTE — Patient Instructions (Addendum)
General Instructions: Please come back Wednesday for bloodwork and 2 weeks to 1 month for follow up for your blood pressure (1st of 2nd week of May 8 th or 11th) Pick up your medication from pharmacy  Consider colonoscopy screening for colon cancer and pneumonia vaccine    Treatment Goals:  Goals (1 Years of Data) as of 01/14/14         As of Today As of Today 09/02/11     Blood Pressure    . Blood Pressure < 140/90  203/95 180/98 178/99      Progress Toward Treatment Goals:  Treatment Goal 01/14/2014  Blood pressure deteriorated    Self Care Goals & Plans:  Self Care Goal 01/14/2014  Manage my medications take my medicines as prescribed; bring my medications to every visit; refill my medications on time; follow the sick day instructions if I am sick  Monitor my health keep track of my weight  Eat healthy foods eat more vegetables; eat fruit for snacks and desserts; eat foods that are low in salt; eat baked foods instead of fried foods; eat smaller portions; drink diet soda or water instead of juice or soda  Be physically active find an activity I enjoy  Meeting treatment goals maintain the current self-care plan    No flowsheet data found.   Care Management & Community Referrals:  Referral 01/14/2014  Referrals made for care management support none needed  Referrals made to community resources none       Exercise to Lose Weight Exercise and a healthy diet may help you lose weight. Your doctor may suggest specific exercises. EXERCISE IDEAS AND TIPS  Choose low-cost things you enjoy doing, such as walking, bicycling, or exercising to workout videos.  Take stairs instead of the elevator.  Walk during your lunch break.  Park your car further away from work or school.  Go to a gym or an exercise class.  Start with 5 to 10 minutes of exercise each day. Build up to 30 minutes of exercise 4 to 6 days a week.  Wear shoes with good support and comfortable  clothes.  Stretch before and after working out.  Work out until you breathe harder and your heart beats faster.  Drink extra water when you exercise.  Do not do so much that you hurt yourself, feel dizzy, or get very short of breath. Exercises that burn about 150 calories:  Running 1  miles in 15 minutes.  Playing volleyball for 45 to 60 minutes.  Washing and waxing a car for 45 to 60 minutes.  Playing touch football for 45 minutes.  Walking 1  miles in 35 minutes.  Pushing a stroller 1  miles in 30 minutes.  Playing basketball for 30 minutes.  Raking leaves for 30 minutes.  Bicycling 5 miles in 30 minutes.  Walking 2 miles in 30 minutes.  Dancing for 30 minutes.  Shoveling snow for 15 minutes.  Swimming laps for 20 minutes.  Walking up stairs for 15 minutes.  Bicycling 4 miles in 15 minutes.  Gardening for 30 to 45 minutes.  Jumping rope for 15 minutes.  Washing windows or floors for 45 to 60 minutes. Document Released: 10/12/2010 Document Revised: 12/02/2011 Document Reviewed: 10/12/2010 [facility] Patient Information 2014 [place].  Cholesterol Cholesterol is a white, waxy, fat-like protein needed by your body in small amounts. The liver makes all the cholesterol you need. It is carried from the liver by the blood through the blood vessels. Deposits (  plaque) may build up on blood vessel walls. This makes the arteries narrower and stiffer. Plaque increases the risk for heart attack and stroke. You cannot feel your cholesterol level even if it is very high. The only way to know is by a blood test to check your lipid (fats) levels. Once you know your cholesterol levels, you should keep a record of the test results. Work with your caregiver to to keep your levels in the desired range. WHAT THE RESULTS MEAN:  Total cholesterol is a rough measure of all the cholesterol in your blood.  LDL is the so-called bad cholesterol. This is the type that deposits  cholesterol in the walls of the arteries. You want this level to be low.  HDL is the good cholesterol because it cleans the arteries and carries the LDL away. You want this level to be high.  Triglycerides are fat that the body can either burn for energy or store. High levels are closely linked to heart disease. DESIRED LEVELS:  Total cholesterol below 200.  LDL below 100 for people at risk, below 70 for very high risk.  HDL above 50 is good, above 60 is best.  Triglycerides below 150. HOW TO LOWER YOUR CHOLESTEROL:  Diet.  Choose fish or white meat chicken and [place], roasted or baked. Limit fatty cuts of red meat, fried foods, and processed meats, such as sausage and lunch meat.  Eat lots of fresh fruits and vegetables. Choose whole grains, beans, pasta, potatoes and cereals.  Use only small amounts of olive, corn or canola oils. Avoid butter, mayonnaise, shortening or palm kernel oils. Avoid foods with trans-fats.  Use skim/nonfat milk and low-fat/nonfat yogurt and cheeses. Avoid whole milk, cream, ice cream, egg yolks and cheeses. Healthy desserts include angel food cake, ginger snaps, animal crackers, hard candy, popsicles, and low-fat/nonfat frozen yogurt. Avoid pastries, cakes, pies and cookies.  Exercise.  A regular program helps decrease LDL and raises HDL.  Helps with weight control.  Do things that increase your activity level like gardening, walking, or taking the stairs.  Medication.  May be prescribed by your caregiver to help lowering cholesterol and the risk for heart disease.  You may need medicine even if your levels are normal if you have several risk factors. HOME CARE INSTRUCTIONS   Follow your diet and exercise programs as suggested by your caregiver.  Take medications as directed.  Have blood work done when your caregiver feels it is necessary. MAKE SURE YOU:   Understand these instructions.  Will watch your condition.  Will get help right  away if you are not doing well or get worse. Document Released: 06/04/2001 Document Revised: 12/02/2011 Document Reviewed: 06/23/2013 [facility] Patient Information 2014 [place], [place].  Preventing Toenail Fungus from Recurring   Sanitize your shoes with Mycomist spray or a similar shoe sanitizer spray.  Follow the instructions on the bottle and dry them outside in the sun or with a hairdryer.  We also recommend repeating the sanitization once weekly in shoes you wear most often.   Throw away any shoes you have worn a significant amount without socks-fungus thrives in a warm moist environment and you want to avoid re-infection after your laser procedure   Bleach your socks with regular or color safe bleach   Change your socks regularly to keep your feet clean and dry (especially if you have sweaty feet)-if sweaty feet are a problem, let your doctor know-there is a great lotion that helps with this problem.  Clean your toenail clippers with alcohol before you use them if you do your own toenails and make sure to replace Emory boards and orange sticks regularly   If you get regular pedicures, bring your own instruments or go to a spa that sterilizes their instruments in an autoclave. DASH Diet The DASH diet stands for "Dietary Approaches to Stop Hypertension." It is a healthy eating plan that has been shown to reduce high blood pressure (hypertension) in as little as 14 days, while also possibly providing other significant health benefits. These other health benefits include reducing the risk of breast cancer after menopause and reducing the risk of type 2 diabetes, heart disease, colon cancer, and stroke. Health benefits also include weight loss and slowing kidney failure in patients with chronic kidney disease.  DIET GUIDELINES  Limit salt (sodium). Your diet should contain less than 1500 mg of sodium daily.  Limit refined or processed carbohydrates. Your diet should include mostly whole  grains. Desserts and added sugars should be used sparingly.  Include small amounts of heart-healthy fats. These types of fats include nuts, oils, and tub margarine. Limit saturated and trans fats. These fats have been shown to be harmful in the body. CHOOSING FOODS  The following food groups are based on a 2000 calorie diet. See your Registered Dietitian for individual calorie needs. Grains and Grain Products (6 to 8 servings daily)  Eat More Often: Whole-wheat bread, brown rice, whole-grain or wheat pasta, quinoa, popcorn without added fat or salt (air popped).  Eat Less Often: White bread, white pasta, white rice, cornbread. Vegetables (4 to 5 servings daily)  Eat More Often: Fresh, frozen, and canned vegetables. Vegetables may be raw, steamed, roasted, or grilled with a minimal amount of fat.  Eat Less Often/Avoid: Creamed or fried vegetables. Vegetables in a cheese sauce. Fruit (4 to 5 servings daily)  Eat More Often: All fresh, canned (in natural juice), or frozen fruits. Dried fruits without added sugar. One hundred percent fruit juice ( cup [237 mL] daily).  Eat Less Often: Dried fruits with added sugar. Canned fruit in light or heavy syrup. [employer], Fish, and Poultry (2 servings or less daily. One serving is 3 to 4 oz [85-114 g]).  Eat More Often: Ninety percent or leaner ground beef, tenderloin, sirloin. Round cuts of beef, chicken breast, [place] breast. All fish. Grill, bake, or broil your meat. Nothing should be fried.  Eat Less Often/Avoid: Fatty cuts of meat, [place], or chicken leg, thigh, or wing. Fried cuts of meat or fish. Dairy (2 to 3 servings)  Eat More Often: Low-fat or fat-free milk, low-fat plain or light yogurt, reduced-fat or part-skim cheese.  Eat Less Often/Avoid: Milk (whole, 2%).Whole milk yogurt. Full-fat cheeses. Nuts, Seeds, and Legumes (4 to 5 servings per week)  Eat More Often: All without added salt.  Eat Less Often/Avoid: Salted nuts and  seeds, canned beans with added salt. Fats and Sweets (limited)  Eat More Often: Vegetable oils, tub margarines without trans fats, sugar-free gelatin. Mayonnaise and salad dressings.  Eat Less Often/Avoid: Coconut oils, palm oils, butter, stick margarine, cream, half and half, cookies, candy, pie. FOR MORE INFORMATION The Dash Diet Eating Plan: www.dashdiet.org Document Released: 08/29/2011 Document Revised: 12/02/2011 Document Reviewed: 08/29/2011 [facility] Patient Information 2014 [place], [place].  Hypertension As your heart beats, it forces blood through your arteries. This force is your blood pressure. If the pressure is too high, it is called hypertension (HTN) or high blood pressure. HTN is dangerous because  you may have it and not know it. High blood pressure may mean that your heart has to work harder to pump blood. Your arteries may be narrow or stiff. The extra work puts you at risk for heart disease, stroke, and other problems.  Blood pressure consists of two numbers, a higher number over a lower, 110/72, for example. It is stated as "110 over 72." The ideal is below 120 for the top number (systolic) and under 80 for the bottom (diastolic). Write down your blood pressure today. You should pay close attention to your blood pressure if you have certain conditions such as:  Heart failure.  Prior heart attack.  Diabetes  Chronic kidney disease.  Prior stroke.  Multiple risk factors for heart disease. To see if you have HTN, your blood pressure should be measured while you are seated with your arm held at the level of the heart. It should be measured at least twice. A one-time elevated blood pressure reading (especially in the Emergency Department) does not mean that you need treatment. There may be conditions in which the blood pressure is different between your right and left arms. It is important to see your caregiver soon for a recheck. Most people have essential hypertension  which means that there is not a specific cause. This type of high blood pressure may be lowered by changing lifestyle factors such as:  Stress.  Smoking.  Lack of exercise.  Excessive weight.  Drug/tobacco/alcohol use.  Eating less salt. Most people do not have symptoms from high blood pressure until it has caused damage to the body. Effective treatment can often prevent, delay or reduce that damage. TREATMENT  When a cause has been identified, treatment for high blood pressure is directed at the cause. There are a large number of medications to treat HTN. These fall into several categories, and your caregiver will help you select the medicines that are best for you. Medications may have side effects. You should review side effects with your caregiver. If your blood pressure stays high after you have made lifestyle changes or started on medicines,   Your medication(s) may need to be changed.  Other problems may need to be addressed.  Be certain you understand your prescriptions, and know how and when to take your medicine.  Be sure to follow up with your caregiver within the time frame advised (usually within two weeks) to have your blood pressure rechecked and to review your medications.  If you are taking more than one medicine to lower your blood pressure, make sure you know how and at what times they should be taken. Taking two medicines at the same time can result in blood pressure that is too low. SEEK IMMEDIATE MEDICAL CARE IF:  You develop a severe headache, blurred or changing vision, or confusion.  You have unusual weakness or numbness, or a faint feeling.  You have severe chest or abdominal pain, vomiting, or breathing problems. MAKE SURE YOU:   Understand these instructions.  Will watch your condition.  Will get help right away if you are not doing well or get worse. Document Released: 09/09/2005 Document Revised: 12/02/2011 Document Reviewed:  04/29/2008 [facility] Patient Information 2014 [place], [place].  Lisinopril tablets What is this medicine? LISINOPRIL (lyse IN oh pril) is an ACE inhibitor. This medicine is used to treat high blood pressure and heart failure. It is also used to protect the heart immediately after a heart attack. This medicine may be used for other purposes; ask your  health care provider or pharmacist if you have questions. COMMON BRAND NAME(S): Prinivil, Zestril What should I tell my health care provider before I take this medicine? They need to know if you have any of these conditions: -diabetes -heart or blood vessel disease -immune system disease like lupus or scleroderma -kidney disease -low blood pressure -previous swelling of the tongue, face, or lips with difficulty breathing, difficulty swallowing, hoarseness, or tightening of the throat -an unusual or allergic reaction to lisinopril, other ACE inhibitors, insect venom, foods, dyes, or preservatives -pregnant or trying to get pregnant -breast-feeding How should I use this medicine? Take this medicine by mouth with a glass of water. Follow the directions on your prescription label. You may take this medicine with or without food. Take your medicine at regular intervals. Do not stop taking this medicine except on the advice of your doctor or health care professional. Talk to your pediatrician regarding the use of this medicine in children. Special care may be needed. While this drug may be prescribed for children as young as 73 years of age for selected conditions, precautions do apply. Overdosage: If you think you have taken too much of this medicine contact a poison control center or emergency room at once. NOTE: This medicine is only for you. Do not share this medicine with others. What if I miss a dose? If you miss a dose, take it as soon as you can. If it is almost time for your next dose, take only that dose. Do not take double or extra  doses. What may interact with this medicine? -diuretics -lithium -NSAIDs, medicines for pain and inflammation, like ibuprofen or naproxen -over-the-counter herbal supplements like hawthorn -potassium salts or potassium supplements -salt substitutes This list may not describe all possible interactions. Give your health care provider a list of all the medicines, herbs, non-prescription drugs, or dietary supplements you use. Also tell them if you smoke, drink alcohol, or use illegal drugs. Some items may interact with your medicine. What should I watch for while using this medicine? Visit your doctor or health care professional for regular check ups. Check your blood pressure as directed. Ask your doctor what your blood pressure should be, and when you should contact him or her. Call your doctor or health care professional if you notice an irregular or fast heart beat. Women should inform their doctor if they wish to become pregnant or think they might be pregnant. There is a potential for serious side effects to an unborn child. Talk to your health care professional or pharmacist for more information. Check with your doctor or health care professional if you get an attack of severe diarrhea, nausea and vomiting, or if you sweat a lot. The loss of too much body fluid can make it dangerous for you to take this medicine. You may get drowsy or dizzy. Do not drive, use machinery, or do anything that needs mental alertness until you know how this drug affects you. Do not stand or sit up quickly, especially if you are an older patient. This reduces the risk of dizzy or fainting spells. Alcohol can make you more drowsy and dizzy. Avoid alcoholic drinks. Avoid salt substitutes unless you are told otherwise by your doctor or health care professional. Do not treat yourself for coughs, colds, or pain while you are taking this medicine without asking your doctor or health care professional for advice. Some  ingredients may increase your blood pressure. What side effects may I notice from receiving this  medicine? Side effects that you should report to your doctor or health care professional as soon as possible: -abdominal pain with or without nausea or vomiting -allergic reactions like skin rash or hives, swelling of the hands, feet, face, lips, throat, or tongue -dark urine -difficulty breathing -dizzy, lightheaded or fainting spell -fever or sore throat -irregular heart beat, chest pain -pain or difficulty passing urine -redness, blistering, peeling or loosening of the skin, including inside the mouth -unusually weak -yellowing of the eyes or skin Side effects that usually do not require medical attention (report to your doctor or health care professional if they continue or are bothersome): -change in taste -cough -decreased sexual function or desire -headache -sun sensitivity -tiredness This list may not describe all possible side effects. Call your doctor for medical advice about side effects. You may report side effects to FDA at 1-800-FDA-1088. Where should I keep my medicine? Keep out of the reach of children. Store at room temperature between 15 and 30 degrees C (59 and 86 degrees F). Protect from moisture. Keep container tightly closed. Throw away any unused medicine after the expiration date. NOTE: This sheet is a summary. It may not cover all possible information. If you have questions about this medicine, talk to your doctor, pharmacist, or health care provider.  2014, Elsevier/Gold Standard. (2008-03-14 17:36:32)  Pneumococcal Vaccine, Polyvalent solution for injection What is this medicine? PNEUMOCOCCAL VACCINE, POLYVALENT (NEU mo KOK al vak SEEN, pol ee VEY luhnt) is a vaccine to prevent pneumococcus bacteria infection. These bacteria are a major cause of ear infections, Strep throat infections, and serious pneumonia, meningitis, or blood infections worldwide. These  vaccines help the body to produce antibodies (protective substances) that help your body defend against these bacteria. This vaccine is recommended for people 7 years of age and older with health problems. It is also recommended for all adults over 59 years old. This vaccine will not treat an infection. This medicine may be used for other purposes; ask your health care provider or pharmacist if you have questions. COMMON BRAND NAME(S): Pneumovax 23 What should I tell my health care provider before I take this medicine? They need to know if you have any of these conditions: -bleeding problems -bone marrow or organ transplant -cancer, Hodgkin's disease -fever -infection -immune system problems -low platelet count in the blood -seizures -an unusual or allergic reaction to pneumococcal vaccine, diphtheria toxoid, other vaccines, latex, other medicines, foods, dyes, or preservatives -pregnant or trying to get pregnant -breast-feeding How should I use this medicine? This vaccine is for injection into a muscle or under the skin. It is given by a health care professional. A copy of Vaccine Information Statements will be given before each vaccination. Read this sheet carefully each time. The sheet may change frequently. Talk to your pediatrician regarding the use of this medicine in children. While this drug may be prescribed for children as young as 11 years of age for selected conditions, precautions do apply. Overdosage: If you think you have taken too much of this medicine contact a poison control center or emergency room at once. NOTE: This medicine is only for you. Do not share this medicine with others. What if I miss a dose? It is important not to miss your dose. Call your doctor or health care professional if you are unable to keep an appointment. What may interact with this medicine? -medicines for cancer chemotherapy -medicines that suppress your immune function -medicines that treat or  prevent blood clots like  warfarin, enoxaparin, and dalteparin -steroid medicines like prednisone or cortisone This list may not describe all possible interactions. Give your health care provider a list of all the medicines, herbs, non-prescription drugs, or dietary supplements you use. Also tell them if you smoke, drink alcohol, or use illegal drugs. Some items may interact with your medicine. What should I watch for while using this medicine? Mild fever and pain should go away in 3 days or less. Report any unusual symptoms to your doctor or health care professional. What side effects may I notice from receiving this medicine? Side effects that you should report to your doctor or health care professional as soon as possible: -allergic reactions like skin rash, itching or hives, swelling of the face, lips, or tongue -breathing problems -confused -fever over 102 degrees F -pain, tingling, numbness in the hands or feet -seizures -unusual bleeding or bruising -unusual muscle weakness Side effects that usually do not require medical attention (report to your doctor or health care professional if they continue or are bothersome): -aches and pains -diarrhea -fever of 102 degrees F or less -headache -irritable -loss of appetite -pain, tender at site where injected -trouble sleeping This list may not describe all possible side effects. Call your doctor for medical advice about side effects. You may report side effects to FDA at 1-800-FDA-1088. Where should I keep my medicine? This does not apply. This vaccine is given in a clinic, pharmacy, doctor's office, or other health care setting and will not be stored at home. NOTE: This sheet is a summary. It may not cover all possible information. If you have questions about this medicine, talk to your doctor, pharmacist, or health care provider.  2014, Elsevier/Gold Standard. (2008-04-15 14:32:37)  Ciclopirox skin cream or gel What is this  medicine? CICLOPIROX (sye kloe PEER ox) is an antifungal medicine. It is used to treat certain kinds of fungal or yeast infections of the skin. This medicine may be used for other purposes; ask your health care provider or pharmacist if you have questions. COMMON BRAND NAME(S): Ciclodan, Loprox What should I tell my health care provider before I take this medicine? They need to know if you have any of these conditions: -large areas of burned or damaged skin -an unusual or allergic reaction to ciclopirox, mineral oil, benzyl alcohol, other medicines, foods, dyes, or preservatives -pregnant or trying to get pregnant -breast-feeding How should I use this medicine? This medicine is for use on the skin only. Follow the directions on the prescription label. Wash your hands before and after use. If treating infections on the hand, only wash hands before use. Apply a thin layer to the affected area and rub in gently. Do not use an airtight bandage to cover the affected area unless your doctor or health care professional tells you to. Do not get this medicine in your eyes. If you do, rinse out with plenty of cool tap water. Use at regular intervals. Do not use your medicine more often than directed. Finish the full course prescribed by your doctor or health care professional even if you think you are better. Do not stop using except on your doctor's advice. Talk to your pediatrician regarding the use of this medicine in children. While this drug may be prescribed for children as young as 48 years of age or older for selected conditions, precautions do apply. Overdosage: If you think you have taken too much of this medicine contact a poison control center or emergency room at once. NOTE:  This medicine is only for you. Do not share this medicine with others. What if I miss a dose? If you miss a dose, use it as soon as you can. If it is almost time for your next dose, use only that dose. Do not use double or  take extra doses without advice. What may interact with this medicine? Interactions are not expected. Do not use any other skin products without telling your doctor or health care professional. This list may not describe all possible interactions. Give your health care provider a list of all the medicines, herbs, non-prescription drugs, or dietary supplements you use. Also tell them if you smoke, drink alcohol, or use illegal drugs. Some items may interact with your medicine. What should I watch for while using this medicine? Tell your doctor or health care professional if your symptoms do not improve within 2 to 4 weeks, or of they get worse. Tell your doctor or health care professional if you develop sores or blisters that do not heal properly. If your skin infection returns after you stop using this medicine, contact your doctor or health care professional. If you are using this medicine for jock itch, do not wear underwear that is tight-fitting or made from synthetic fibers such as rayon or nylon. Instead, wear loose-fitting, cotton underwear. Dry the area completely after bathing. If you are using this medicine to treat athlete's foot, carefully dry the feet, especially between the toes, after bathing. Do not wear socks made from wool or synthetic materials such as rayon or nylon. Wear clean cotton socks and change them daily or more if your feet sweat a lot. Try wearing sandals or shoes that are well-ventilated. An absorbent powder such as talcum powder or an antifungal powder may be applied to the skin to keep it dry. Apply the powder to the affected skin in between applications of this medicine. Tinea versicolor (sun fungus) may cause you to have patches of skin that are lighter or darker than the surrounding skin areas. Treatment with this medicine will not restore normal skin coloring right away. It may take a few months for the skin appearance to improve. What side effects may I notice from  receiving this medicine? Side effects that you should report to your doctor or health care professional as soon as possible: -allergic reactions like skin rash, itching or hives, swelling of the face, lips, or tongue -skin peeling Side effects that usually do not require medical attention (report to your doctor or health care professional if they continue or are bothersome): -mild burning, stinging, itching, swelling, or other signs of skin irritation -redness of skin This list may not describe all possible side effects. Call your doctor for medical advice about side effects. You may report side effects to FDA at 1-800-FDA-1088. Where should I keep my medicine? Keep out of the reach of children. Store between 15 and 30 degrees C (59 and 86 degrees F). Do not freeze. Throw away any unused medicine after the expiration date. NOTE: This sheet is a summary. It may not cover all possible information. If you have questions about this medicine, talk to your doctor, pharmacist, or health care provider.  2014, Elsevier/Gold Standard. (2007-12-14 16:50:33)  Hydrochlorothiazide, HCTZ capsules or tablets What is this medicine? HYDROCHLOROTHIAZIDE (hye droe klor oh THYE a zide) is a diuretic. It increases the amount of urine passed, which causes the body to lose salt and water. This medicine is used to treat high blood pressure. It is also  reduces the swelling and water retention caused by various medical conditions, such as heart, liver, or kidney disease. This medicine may be used for other purposes; ask your health care provider or pharmacist if you have questions. COMMON BRAND NAME(S): Esidrix, Ezide, HydroDIURIL, Microzide, Oretic, Zide What should I tell my health care provider before I take this medicine? They need to know if you have any of these conditions: -diabetes -gout -immune system problems, like lupus -kidney disease or kidney stones -liver disease -pancreatitis -small amount of urine  or difficulty passing urine -an unusual or allergic reaction to hydrochlorothiazide, sulfa drugs, other medicines, foods, dyes, or preservatives -pregnant or trying to get pregnant -breast-feeding How should I use this medicine? Take this medicine by mouth with a glass of water. Follow the directions on the prescription label. Take your medicine at regular intervals. Remember that you will need to pass urine frequently after taking this medicine. Do not take your doses at a time of day that will cause you problems. Do not stop taking your medicine unless your doctor tells you to. Talk to your pediatrician regarding the use of this medicine in children. Special care may be needed. Overdosage: If you think you have taken too much of this medicine contact a poison control center or emergency room at once. NOTE: This medicine is only for you. Do not share this medicine with others. What if I miss a dose? If you miss a dose, take it as soon as you can. If it is almost time for your next dose, take only that dose. Do not take double or extra doses. What may interact with this medicine? -cholestyramine -colestipol -digoxin -dofetilide -lithium -medicines for blood pressure -medicines for diabetes -medicines that relax muscles for surgery -other diuretics -steroid medicines like prednisone or cortisone This list may not describe all possible interactions. Give your health care provider a list of all the medicines, herbs, non-prescription drugs, or dietary supplements you use. Also tell them if you smoke, drink alcohol, or use illegal drugs. Some items may interact with your medicine. What should I watch for while using this medicine? Visit your doctor or health care professional for regular checks on your progress. Check your blood pressure as directed. Ask your doctor or health care professional what your blood pressure should be and when you should contact him or her. You may need to be on a  special diet while taking this medicine. Ask your doctor. Check with your doctor or health care professional if you get an attack of severe diarrhea, nausea and vomiting, or if you sweat a lot. The loss of too much body fluid can make it dangerous for you to take this medicine. You may get drowsy or dizzy. Do not drive, use machinery, or do anything that needs mental alertness until you know how this medicine affects you. Do not stand or sit up quickly, especially if you are an older patient. This reduces the risk of dizzy or fainting spells. Alcohol may interfere with the effect of this medicine. Avoid alcoholic drinks. This medicine may affect your blood sugar level. If you have diabetes, check with your doctor or health care professional before changing the dose of your diabetic medicine. This medicine can make you more sensitive to the sun. Keep out of the sun. If you cannot avoid being in the sun, wear protective clothing and use sunscreen. Do not use sun lamps or tanning beds/booths. What side effects may I notice from receiving this medicine? Side effects  that you should report to your doctor or health care professional as soon as possible: -allergic reactions such as skin rash or itching, hives, swelling of the lips, mouth, tongue, or throat -changes in vision -chest pain -eye pain -fast or irregular heartbeat -feeling faint or lightheaded, falls -gout attack -muscle pain or cramps -pain or difficulty when passing urine -pain, tingling, numbness in the hands or feet -redness, blistering, peeling or loosening of the skin, including inside the mouth -unusually weak or tired Side effects that usually do not require medical attention (report to your doctor or health care professional if they continue or are bothersome): -change in sex drive or performance -dry mouth -headache -stomach upset This list may not describe all possible side effects. Call your doctor for medical advice about  side effects. You may report side effects to FDA at 1-800-FDA-1088. Where should I keep my medicine? Keep out of the reach of children. Store at room temperature between 15 and 30 degrees C (59 and 86 degrees F). Do not freeze. Protect from light and moisture. Keep container closed tightly. Throw away any unused medicine after the expiration date. NOTE: This sheet is a summary. It may not cover all possible information. If you have questions about this medicine, talk to your doctor, pharmacist, or health care provider.  2014, Elsevier/Gold Standard. (2010-05-04 12:57:37)

## 2014-01-14 NOTE — Assessment & Plan Note (Signed)
Pending lipid panel pt will come back Weds of next week to check He'd previously been given lipitor and pravachol which he never took these medications

## 2014-01-14 NOTE — Progress Notes (Signed)
   Subjective:    Patient ID: Austin Huerta, male    DOB: 06/01/1941, 73 y.o.   MRN: [id number]  HPI Comments: 73 y.o Past Medical History  Hypertension  (BP 180/98 then 203/95),Hyperlipidemia, Gout, Positive PPD                                              He presents for f/u:  1)HTN, HLD f/u-BP 180/98 then 203/95; He needs Rx refills (Lisinopril not taking consistently and has not been seen in the clinic since 2012).  He is trying diet modification (no salt)   2) He also has a toenail fungus and wants a topical medication to treat.   3) He denies h/o falls.   HM: due for colonoscopy and pneumonia vx but declines today and states he will consider.    SH: married but separated from wife currently 3 kids and step kids. 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   Cardiovascular: Negative for chest pain and leg swelling.  Gastrointestinal: Negative for blood in stool.  Genitourinary: Negative for hematuria.       Objective:   Physical Exam  Nursing note and vitals reviewed. Constitutional: He is oriented to person, place, and time. He appears well-developed and well-nourished. He is cooperative.  HENT:  Head: Normocephalic and atraumatic.  Mouth/Throat: Oropharynx is clear and moist and mucous membranes are normal. Abnormal dentition. No oropharyngeal exudate.  Eyes: Conjunctivae are normal. Pupils are equal, round, and reactive to light. Right eye exhibits no discharge. Left eye exhibits no discharge. No scleral icterus.  Cardiovascular: Normal rate, regular rhythm, S1 normal, S2 normal and normal heart sounds.   No murmur heard. No lower ext edema   Pulmonary/Chest: Effort normal and breath sounds normal.  Abdominal: Soft. Bowel sounds are normal. He exhibits no  distension. There is no tenderness.  obese  Musculoskeletal: He exhibits no edema.  Neurological: He is alert and oriented to person, place, and time. Gait normal.  Skin: Skin is warm, dry and intact. No rash noted.  Psychiatric: He has a normal mood and affect. His speech is normal and behavior is normal. Judgment and thought content normal. Cognition and memory are normal.          Assessment & Plan:  Pt to come back Weds (next week for CMET, lipid check) then 1-2 weeks after getting BP medication to f/u for BP and titrate up medication prn for BP control

## 2014-01-14 NOTE — Assessment & Plan Note (Signed)
BP Readings from Last 3 Encounters:  01/14/14 203/95  09/02/11 178/99  07/15/11 140/78    Lab Results  Component Value Date   NA 138 05/13/2011   K 4.1 05/13/2011   CREATININE 0.90 05/13/2011    Assessment: Blood pressure control: moderately elevated Progress toward BP goal:  deteriorated Comments: 180/98 then 203/95   Plan: Medications:  continue current medications (Pt wanted Rx refill Lisinopril 20 mg but given uncontrolled BP Rx Lisinopril 20-HCTZ 12.5 mg qd)  Educational resources provided: brochure;handout;video Self management tools provided: other (see comments) Other plans: wanted to check CMET today but pt will come back Weds (next week) then in 1-2 weeks to f/u BP and titrate up medication if needed for BP control

## 2014-01-17 NOTE — Progress Notes (Signed)
INTERNAL MEDICINE TEACHING ATTENDING ADDENDUM - Gwynn Crossley, MD: I reviewed and discussed at the time of visit with the resident Dr. McLean, the patient's medical history, physical examination, diagnosis and results of tests and treatment and I agree with the patient's care as documented. 

## 2015-06-21 ENCOUNTER — Encounter: Payer: Self-pay | Admitting: Internal Medicine

## 2015-06-21 ENCOUNTER — Ambulatory Visit (INDEPENDENT_AMBULATORY_CARE_PROVIDER_SITE_OTHER): Payer: Medicare Other | Admitting: Internal Medicine

## 2015-06-21 VITALS — BP 156/72 | HR 56 | Temp 97.7°F | Ht 71.0 in | Wt 202.9 lb

## 2015-06-21 DIAGNOSIS — Z0000 Encounter for general adult medical examination without abnormal findings: Secondary | ICD-10-CM

## 2015-06-21 DIAGNOSIS — N528 Other male erectile dysfunction: Secondary | ICD-10-CM

## 2015-06-21 DIAGNOSIS — H6120 Impacted cerumen, unspecified ear: Secondary | ICD-10-CM | POA: Insufficient documentation

## 2015-06-21 DIAGNOSIS — N529 Male erectile dysfunction, unspecified: Secondary | ICD-10-CM | POA: Insufficient documentation

## 2015-06-21 DIAGNOSIS — H538 Other visual disturbances: Secondary | ICD-10-CM

## 2015-06-21 DIAGNOSIS — H6122 Impacted cerumen, left ear: Secondary | ICD-10-CM | POA: Diagnosis not present

## 2015-06-21 DIAGNOSIS — I10 Essential (primary) hypertension: Secondary | ICD-10-CM

## 2015-06-21 MED ORDER — LISINOPRIL 20 MG PO TABS: 20.0000 mg | ORAL_TABLET | Freq: Every day | ORAL | Status: AC

## 2015-06-21 MED ORDER — SILDENAFIL CITRATE 25 MG PO TABS: 25.0000 mg | ORAL_TABLET | Freq: Every day | ORAL | Status: DC | PRN

## 2015-06-21 NOTE — Assessment & Plan Note (Addendum)
Patient says he has been having a  Hard time getting/ having an erection over the past year, and it is not the way it used to be. Sometimes but very rarely he is able to, also says this happens most mornings. He has HTN with other signficant medical problems. He has never had chest pains or cardiac disease. He will like to give viagra a trial.  Plan- Viagra  to start, can increase to  daily if not effective. He was given instructions on how to use it, to take it- - 4 hrs before. #10 pills given. - Also cautioned about side effects,and not to take nitrates, he is not on Nitrates.

## 2015-06-21 NOTE — Assessment & Plan Note (Signed)
Had ear irrigation today with flushing out of large amount of cerumen. Hearing much improved.

## 2015-06-21 NOTE — Assessment & Plan Note (Signed)
BP Readings from Last 3 Encounters:  06/21/15 156/72  01/14/14 203/95  09/02/11 178/99    Lab Results  Component Value Date   NA 138 05/13/2011   K 4.1 05/13/2011   CREATININE 0.90 05/13/2011    Assessment: Blood pressure control:   Acceptable, but could do better.  Comments: presently only on lisinopril-  daily. He stopped taking the HCTZ due to hx of gout and possible precipitation f gout flare.   Plan: Medications:  continue current medications Other plans: - Bring BP log next visit - Bmet today - CBC today - Will not adjust blood pressure meds today, he has hardly any co-morbidities neccessitating need for more aggressive Bp control. Goal- 150s/80.  - Can consider optimizing lisinopril next visit.  - Follow up in a month for Bp check and tolerance of viagra.

## 2015-06-21 NOTE — Patient Instructions (Signed)
Please check your blood pressure at least 2-3 times a week, when you get to the drug stores.   For the viagra, we will prescribe  once a day, take it to 4 hours before sexual activity. This dose can be increased to double the dose-  once a day. We will see you in 1 month to see how you are doing.

## 2015-06-21 NOTE — Assessment & Plan Note (Signed)
Declined flu shot and colonoscopy today.

## 2015-06-21 NOTE — Progress Notes (Signed)
Patient ID: Austin Huerta, male   DOB: Aug 10, 1941, 74 y.o.   MRN: [id number]   Subjective:   Patient ID: Austin Huerta male   DOB: 11/08/1940 74 y.o.   MRN: [id number]  HPI: [name] is a 74 y.o. with PMH listed below. Please see problem based charting for status on pts chronic medical conditions addressed today.   Past Medical History  Diagnosis Date  . Hypertension   . Hyperlipidemia   . Gout   . Positive PPD     History of   Current Outpatient Prescriptions  Medication Sig Dispense Refill  . aspirin EC 81 MG tablet Take 1 tablet (81 mg total) by mouth daily. 150 tablet 2  . Ciclopirox 0.77 % gel Apply 1 application topically 2 (two) times daily. 100 g 0  . lisinopril-hydrochlorothiazide (PRINZIDE,ZESTORETIC) 20-12.5 MG per tablet Take 1 tablet by mouth daily. 30 tablet 2  . pravastatin (PRAVACHOL) 40 MG tablet Take 1 tablet (40 mg total) by mouth every evening. 30 tablet 11   No current facility-administered medications for this visit.   No family history on file. Social History   Social History  . Marital Status: Legally Separated    Spouse Name: N/A  . Number of Children: N/A  . Years of Education: N/A   Social History Main Topics  . Smoking status: Not on file  . Smokeless tobacco: Not on file  . Alcohol Use: 0.0 oz/week    0 Standard drinks or equivalent per week     Comment: occassionally  . Drug Use: Not on file  . Sexual Activity: Not on file   Other Topics Concern  . 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 SKIN- No Rash, colour changes or itching. HEAD- No Headache or dizziness. Eyes- complains of blurry vision. 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 Mouth/throat- No Sorethroat, or bleeding gums. RESPIRATORY- No Cough or  SOB. CARDIAC- No Palpitations, or chest pain. GI- No vomiting, diarrhoea, abd pain. URINARY- No Frequency, or dysuria. NEUROLOGIC- No Numbness, or burning. [facility]- Denies depression or anxiety.  Objective:  Physical Exam: Filed Vitals:   06/21/15 1443  BP: 156/72  Pulse: 56  Temp: 97.7 F (36.5 C)  TempSrc: Oral  Height:  (1.803 m)  Weight: 202 lb 14.4 oz (92.035 kg)  SpO2: 97%   GENERAL- alert, co-operative, appears as stated age, not in any distress. HEENT- Atraumatic, normocephalic, PERRL, EOMI, oral mucosa appears moist, neck supple. Ears- right normal , ear drum clearly visualized, left ear- debris present with cerumen also present. CARDIAC- RRR, no murmurs, rubs or gallops. RESP- Moving equal volumes of air, and clear to auscultation bilaterally, no wheezes or crackles. ABDOMEN- Soft, nontender,, bowel sounds present. BACK- Normal curvature of the spine, No tenderness along the vertebrae, no CVA tenderness. NEURO- Alert an oriented, Gait- Normal. EXTREMITIES- pulse 2+, symmetric, no pedal edema. SKIN- Warm, dry, No rash or lesion. PSYCH- Normal mood and affect, appropriate thought content and speech.  Assessment & Plan:  The patient's case and plan of care was discussed with attending physician, Dr.Mullen.  Please see problem based charting for assessment and plan.

## 2015-06-22 LAB — BASIC METABOLIC PANEL
BUN / CREAT RATIO: 19 (ref 10–22)
BUN: 17 mg/dL (ref 8–27)
CHLORIDE: 101 mmol/L (ref 97–108)
CO2: 23 mmol/L (ref 18–29)
Calcium: 9.6 mg/dL (ref 8.6–10.2)
Creatinine, Ser: 0.88 mg/dL (ref 0.76–1.27)
GFR calc non Af Amer: 85 mL/min/{1.73_m2} (ref >59)
GFR, EST AFRICAN AMERICAN: 99 mL/min/{1.73_m2} (ref >59)
Glucose: 93 mg/dL (ref 65–99)
POTASSIUM: 4.6 mmol/L (ref 3.5–5.2)
SODIUM: 141 mmol/L (ref 134–144)

## 2015-06-22 LAB — CBC
Hematocrit: 41.2 % (ref 37.5–51.0)
Hemoglobin: 13.8 g/dL (ref 12.6–17.7)
MCH: 30.5 pg (ref 26.6–33.0)
MCHC: 33.5 g/dL (ref 31.5–35.7)
MCV: 91 fL (ref 79–97)
PLATELETS: 206 10*3/uL (ref 150–379)
RBC: 4.53 x10E6/uL (ref 4.14–5.80)
RDW: 13.7 % (ref 12.3–15.4)
WBC: 6.1 10*3/uL (ref 3.4–10.8)

## 2015-07-07 NOTE — Progress Notes (Signed)
Internal Medicine Clinic Attending  Case discussed with Dr. Emokpae soon after the resident saw the patient.  We reviewed the resident's history and exam and pertinent patient test results.  I agree with the assessment, diagnosis, and plan of care documented in the resident's note. 

## 2015-08-07 ENCOUNTER — Ambulatory Visit: Payer: Medicare Other | Admitting: Internal Medicine

## 2015-10-04 ENCOUNTER — Encounter: Payer: Medicare Other | Admitting: Internal Medicine

## 2015-10-20 DIAGNOSIS — I10 Essential (primary) hypertension: Secondary | ICD-10-CM | POA: Diagnosis not present

## 2015-10-20 DIAGNOSIS — N529 Male erectile dysfunction, unspecified: Secondary | ICD-10-CM | POA: Diagnosis not present

## 2015-10-20 DIAGNOSIS — N400 Enlarged prostate without lower urinary tract symptoms: Secondary | ICD-10-CM | POA: Diagnosis not present

## 2015-10-20 DIAGNOSIS — M109 Gout, unspecified: Secondary | ICD-10-CM | POA: Diagnosis not present

## 2015-10-20 DIAGNOSIS — E784 Other hyperlipidemia: Secondary | ICD-10-CM | POA: Diagnosis not present

## 2015-11-03 DIAGNOSIS — I10 Essential (primary) hypertension: Secondary | ICD-10-CM | POA: Diagnosis not present

## 2015-11-03 DIAGNOSIS — E785 Hyperlipidemia, unspecified: Secondary | ICD-10-CM | POA: Diagnosis not present

## 2016-01-05 DIAGNOSIS — I10 Essential (primary) hypertension: Secondary | ICD-10-CM | POA: Diagnosis not present

## 2016-01-05 DIAGNOSIS — N400 Enlarged prostate without lower urinary tract symptoms: Secondary | ICD-10-CM | POA: Diagnosis not present

## 2016-01-05 DIAGNOSIS — M109 Gout, unspecified: Secondary | ICD-10-CM | POA: Diagnosis not present

## 2016-01-05 DIAGNOSIS — E784 Other hyperlipidemia: Secondary | ICD-10-CM | POA: Diagnosis not present

## 2016-01-31 ENCOUNTER — Encounter: Payer: Medicare Other | Admitting: Internal Medicine

## 2016-03-18 ENCOUNTER — Emergency Department (HOSPITAL_COMMUNITY): Payer: Medicare Other

## 2016-03-18 ENCOUNTER — Other Ambulatory Visit: Payer: Self-pay

## 2016-03-18 ENCOUNTER — Encounter (HOSPITAL_COMMUNITY): Payer: Self-pay | Admitting: Emergency Medicine

## 2016-03-18 DIAGNOSIS — R002 Palpitations: Secondary | ICD-10-CM | POA: Diagnosis not present

## 2016-03-18 DIAGNOSIS — Z7982 Long term (current) use of aspirin: Secondary | ICD-10-CM | POA: Insufficient documentation

## 2016-03-18 DIAGNOSIS — R55 Syncope and collapse: Secondary | ICD-10-CM | POA: Insufficient documentation

## 2016-03-18 DIAGNOSIS — E869 Volume depletion, unspecified: Secondary | ICD-10-CM | POA: Diagnosis not present

## 2016-03-18 DIAGNOSIS — Z79899 Other long term (current) drug therapy: Secondary | ICD-10-CM | POA: Diagnosis not present

## 2016-03-18 DIAGNOSIS — I10 Essential (primary) hypertension: Secondary | ICD-10-CM | POA: Insufficient documentation

## 2016-03-18 NOTE — ED Notes (Signed)
Pt. reports palpitations this evening with mild SOB and ETOH intake prior to arrival .

## 2016-03-19 ENCOUNTER — Emergency Department (HOSPITAL_COMMUNITY)
Admission: EM | Admit: 2016-03-19 | Discharge: 2016-03-19 | Disposition: A | Discharge status: Home / Self Care | Payer: Medicare Other | Attending: Emergency Medicine | Admitting: Emergency Medicine

## 2016-03-19 DIAGNOSIS — E869 Volume depletion, unspecified: Secondary | ICD-10-CM

## 2016-03-19 DIAGNOSIS — R55 Syncope and collapse: Secondary | ICD-10-CM

## 2016-03-19 LAB — CBC
HEMATOCRIT: 44 % (ref 39.0–52.0)
HEMOGLOBIN: 14 g/dL (ref 13.0–17.0)
MCH: 29.4 pg (ref 26.0–34.0)
MCHC: 31.8 g/dL (ref 30.0–36.0)
MCV: 92.4 fL (ref 78.0–100.0)
Platelets: 192 10*3/uL (ref 150–400)
RBC: 4.76 MIL/uL (ref 4.22–5.81)
RDW: 13.4 % (ref 11.5–15.5)
WBC: 7.9 10*3/uL (ref 4.0–10.5)

## 2016-03-19 LAB — BASIC METABOLIC PANEL
ANION GAP: 17 — AB (ref 5–15)
BUN: 14 mg/dL (ref 6–20)
CALCIUM: 9.2 mg/dL (ref 8.9–10.3)
CO2: 18 mmol/L — AB (ref 22–32)
Chloride: 102 mmol/L (ref 101–111)
Creatinine, Ser: 1.67 mg/dL — ABNORMAL HIGH (ref 0.61–1.24)
GFR calc Af Amer: 45 mL/min — ABNORMAL LOW (ref >60)
GFR, EST NON AFRICAN AMERICAN: 39 mL/min — AB (ref >60)
GLUCOSE: 133 mg/dL — AB (ref 65–99)
POTASSIUM: 3.2 mmol/L — AB (ref 3.5–5.1)
Sodium: 137 mmol/L (ref 135–145)

## 2016-03-19 LAB — I-STAT TROPONIN, ED: Troponin i, poc: 0 ng/mL (ref 0.00–0.08)

## 2016-03-19 NOTE — ED Provider Notes (Signed)
CSN: [id number]     Arrival date & time 03/18/16  2324 History  By signing my name below, I, Bethel BornBritney McCollum, attest that this documentation has been prepared under the direction and in the presence of Azalia BilisKevin Cristy Colmenares, MD. Electronically Signed: Bethel BornBritney McCollum, ED Scribe. 03/19/2016. 3:59 AM   Chief Complaint  Patient presents with  . Palpitations   The history is provided by the patient. No language interpreter was used.   Austin Huerta is a 75 y.o. male with PMHx of HTN and HLD who presents to the Emergency Department complaining of a near-syncopal episode last night. Pt states that he was coming down a flight of stairs when he started to feel lightheaded. He notes that he has not eaten or slept much over the last 2 days and has been drinking Rum and Coke. He denies being a heavy drinker, a bottle of Rum will last 1 month for him.  His symptoms have resolved since coming to the ED. Reports transient palpitations that resolved on their own. Denies CP. No SOB. Denies nausea vomiting and diarrhea. No blood noted in his stool.  Reports he "forgot" to eat today. Felt poorly so he sat in a park and drank water which improved his symptoms.   Past Medical History  Diagnosis Date  . Hypertension   . Hyperlipidemia   . Gout   . Positive PPD     History of   History reviewed. No pertinent past surgical history. No family history on file. Social History  Substance Use Topics  . Smoking status: Never Smoker   . Smokeless tobacco: None  . Alcohol Use: 0.0 oz/week    0 Standard drinks or equivalent per week    Review of Systems  10 Systems reviewed and all are negative for acute change except as noted in the HPI.  Allergies  Review of patient's allergies indicates no known allergies.  Home Medications   Prior to Admission medications   Medication Sig Start Date End Date Taking? Authorizing Provider  aspirin EC 81 MG tablet Take 1 tablet (81 mg total) by mouth daily. 05/13/11 05/12/12  Blanca FriendBrad  Wainright, MD  Ciclopirox 0.77 % gel Apply 1 application topically 2 (two) times daily. 01/14/14   Pasty Spillersracy N McLean-Scocozza, MD  lisinopril (PRINIVIL,ZESTRIL) 20 MG tablet Take 1 tablet (20 mg total) by mouth daily. 06/21/15 06/20/16  Ejiroghene Wendall StadeE Emokpae, MD  pravastatin (PRAVACHOL) 40 MG tablet Take 1 tablet (40 mg total) by mouth every evening. 05/13/11 05/12/12  Blanca FriendBrad Wainright, MD  sildenafil (VIAGRA) 25 MG tablet Take 1 tablet (25 mg total) by mouth daily as needed for erectile dysfunction. 06/21/15   Ejiroghene E Emokpae, MD   BP 109/72 mmHg  Pulse 50  Temp(Src) 98.8 F (37.1 C) (Oral)  Resp 16  SpO2 100% Physical Exam  Constitutional: He is oriented to person, place, and time. He appears well-developed and well-nourished.  HENT:  Head: Normocephalic and atraumatic.  Eyes: EOM are normal.  Neck: Normal range of motion.  Cardiovascular: Normal rate, regular rhythm, normal heart sounds and intact distal pulses.   Pulmonary/Chest: Effort normal and breath sounds normal. No respiratory distress.  Abdominal: Soft. He exhibits no distension. There is no tenderness.  Musculoskeletal: Normal range of motion.  Neurological: He is alert and oriented to person, place, and time.  Skin: Skin is warm and dry.  Psychiatric: He has a normal mood and affect. Judgment normal.  Nursing note and vitals reviewed.   ED Course  Procedures (including critical  care time) DIAGNOSTIC STUDIES: Oxygen Saturation is 100% on RA,  normal by my interpretation.    COORDINATION OF CARE: 3:58 AM Discussed treatment plan which includes lab work, CXR, EKG with pt at bedside and pt agreed to plan.  Labs Review Labs Reviewed  BASIC METABOLIC PANEL - Abnormal; Notable for the following:    Potassium 3.2 (*)    CO2 18 (*)    Glucose, Bld 133 (*)    Creatinine, Ser 1.67 (*)    GFR calc non Af Amer 39 (*)    GFR calc Af Amer 45 (*)    Anion gap 17 (*)    All other components within normal limits  CBC  I-STAT  TROPOININ, ED   BUN  Date Value Ref Range Status  03/18/2016 14 6 - 20 mg/dL Final  16/10/960409/28/2016 17 8 - 27 mg/dL Final  54/09/811908/20/2012 18 6 - 23 mg/dL Final  14/78/295608/02/2011 16 6 - 23 mg/dL Final  21/30/865702/16/2012 25* 6 - 23 mg/dL Final   CREAT  Date Value Ref Range Status  05/13/2011 0.90 0.50 - 1.35 mg/dL Final  84/69/629508/02/2011 2.840.96 0.50 - 1.35 mg/dL Final  13/24/401002/16/2012 2.721.08 0.40 - 1.50 mg/dL Final   CREATININE, SER  Date Value Ref Range Status  03/18/2016 1.67* 0.61 - 1.24 mg/dL Final  53/66/440309/28/2016 4.740.88 0.76 - 1.27 mg/dL Final  25/95/638709/06/2009 5.641.01 (0.40-1.50 mg/dL Final  33/29/518806/24/2009 4.161.03 0.40-1.50 mg/dL Final       Imaging Review Dg Chest 2 View  03/19/2016  CLINICAL DATA:  Palpitations with mild dyspnea. EXAM: CHEST  2 VIEW COMPARISON:  12/16/2005 FINDINGS: The heart size and mediastinal contours are within normal limits. Both lungs are clear. The visualized skeletal structures are unremarkable. IMPRESSION: No active cardiopulmonary disease. Electronically Signed   By: Ellery Plunkaniel R Mitchell M.D.   On: 03/19/2016 00:25   I have personally reviewed and evaluated these images and lab results as part of my medical decision-making.   EKG Interpretation   Date/Time:  Monday March 18 2016 23:31:04 EDT Ventricular Rate:  105 PR Interval:  168 QRS Duration: 88 QT Interval:  366 QTC Calculation: 483 R Axis:   -44 Text Interpretation:  Sinus tachycardia Left axis deviation Abnormal ECG  No old tracing to compare Confirmed by Aleeya Veitch  MD, Caryn BeeKEVIN ([id number]) on  03/19/2016 3:55:41 AM      MDM   Final diagnoses:  None    Likely volume depletion exacerbation by ETOH. Well appearing. No complaints at this time. Feels better after drinking fluids. Tolerating water in ER. Mild renal insuffiency likely related to dehydration. Encouraged ongoing oral hydration at home and decreased ETOH intake. Pt undernstands to return to the ER for new or worsening symptoms  I personally performed the services described in this  documentation, which was scribed in my presence. The recorded information has been reviewed and is accurate.      Azalia BilisKevin Tequlia Gonsalves, MD 03/20/16 [phone]

## 2016-10-11 ENCOUNTER — Encounter: Payer: Self-pay | Admitting: Internal Medicine

## 2016-10-11 ENCOUNTER — Telehealth: Payer: Self-pay | Admitting: Internal Medicine

## 2016-10-11 NOTE — Telephone Encounter (Signed)
Called patient this pm per in basket message sent by Dr. Karma GreaserBoswell.  Used current telephone number on file no answer and voicemail has not been set up so unable to leave a message.  Sending letter asking pt to please get in contact with us to schedule an appt.

## 2017-01-02 IMAGING — DX DG CHEST 2V
2 series · 2 of 2 positions shown · non-contrast
Comparison: 12/16/2005

CLINICAL DATA: Palpitations with mild dyspnea.

EXAM:
CHEST  2 VIEW

[chest pa]
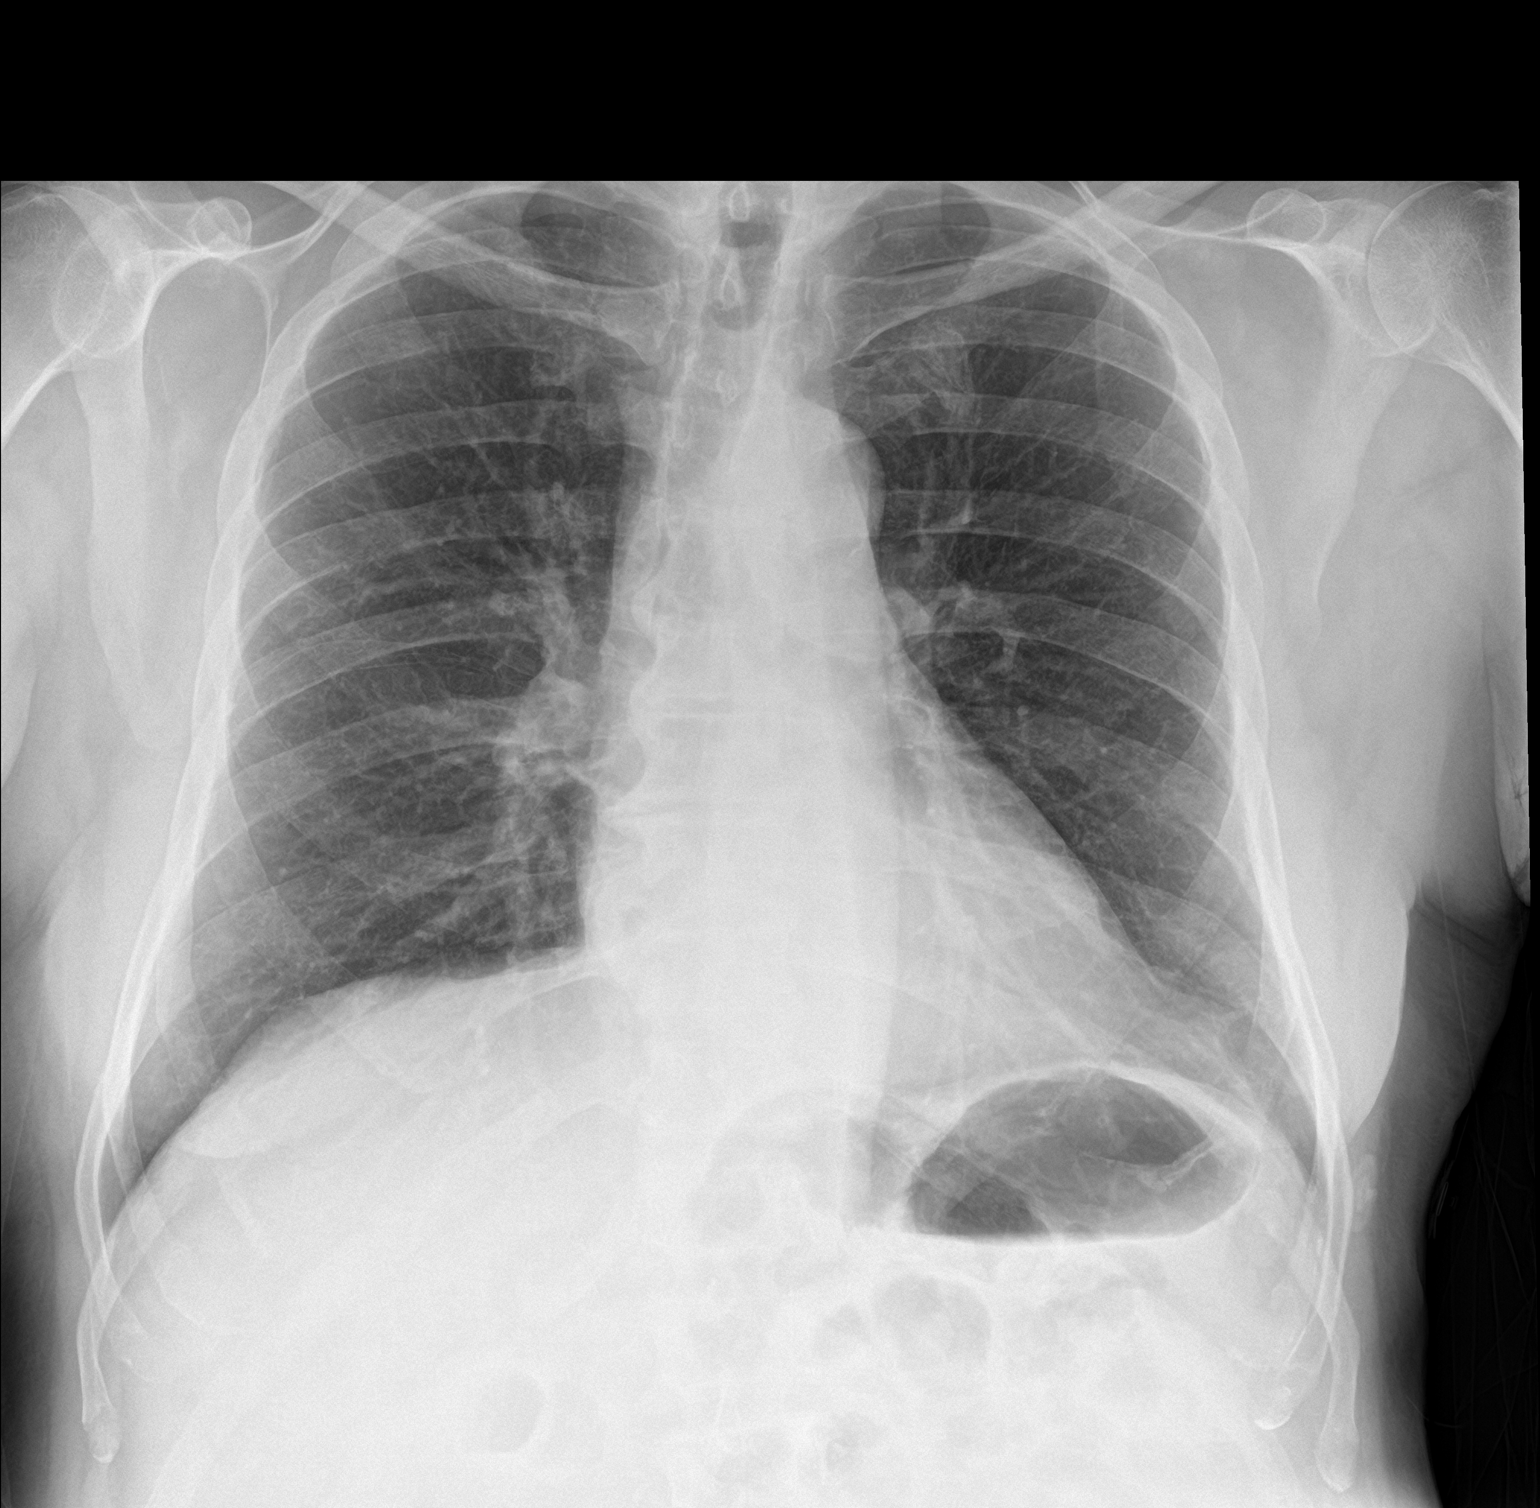

[chest lat]
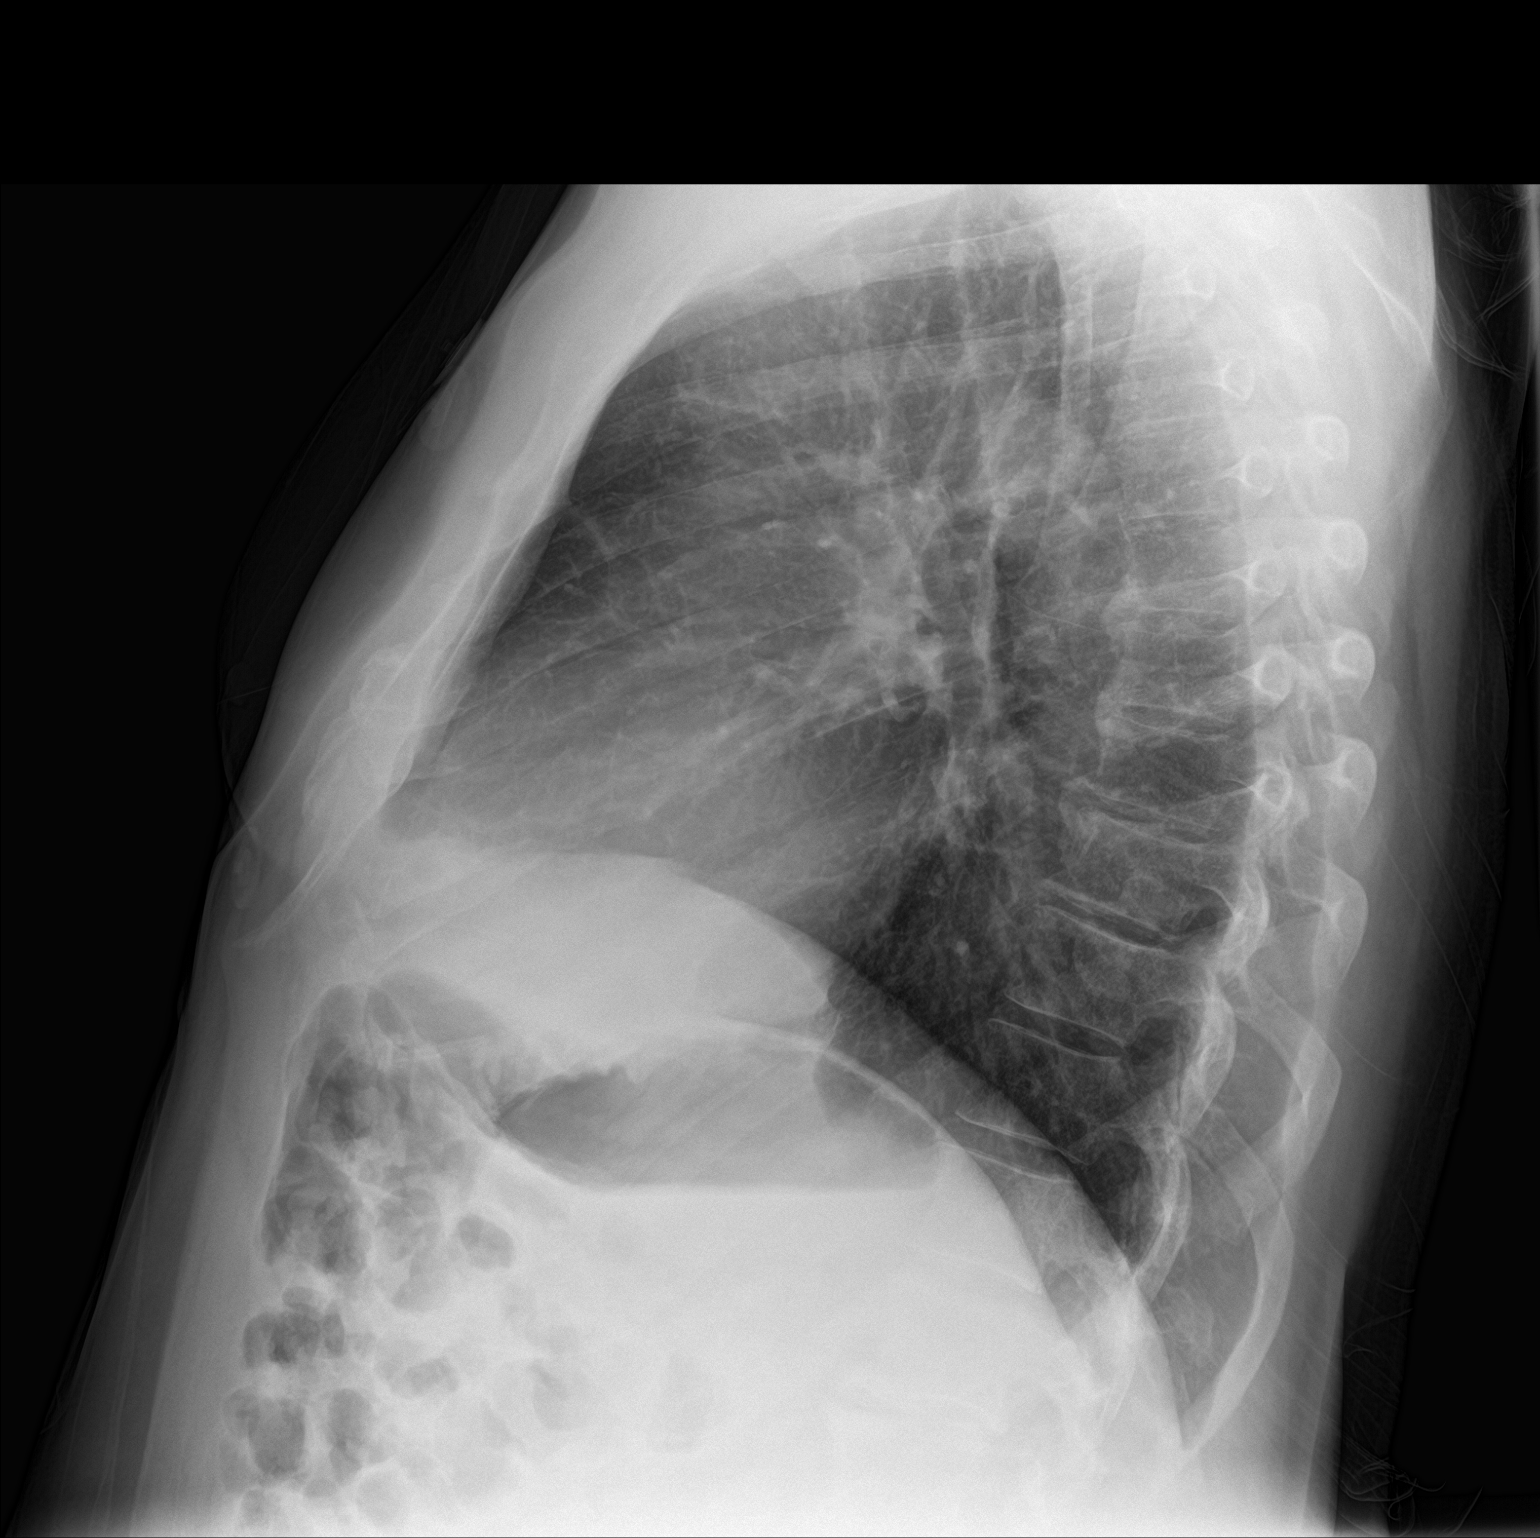

[2 of 2 positions shown; findings below may reference images not displayed]

FINDINGS: The heart size and mediastinal contours are within normal limits.
Both lungs are clear. The visualized skeletal structures are
unremarkable.
IMPRESSION: No active cardiopulmonary disease.

## 2017-10-10 ENCOUNTER — Ambulatory Visit: Payer: Medicare Other

## 2017-10-16 ENCOUNTER — Ambulatory Visit (INDEPENDENT_AMBULATORY_CARE_PROVIDER_SITE_OTHER): Payer: Medicare Other | Admitting: Internal Medicine

## 2017-10-16 ENCOUNTER — Other Ambulatory Visit: Payer: Self-pay

## 2017-10-16 VITALS — BP 185/80 | HR 65 | Temp 98.2°F | Wt 199.9 lb

## 2017-10-16 DIAGNOSIS — N529 Male erectile dysfunction, unspecified: Secondary | ICD-10-CM

## 2017-10-16 DIAGNOSIS — H538 Other visual disturbances: Secondary | ICD-10-CM

## 2017-10-16 DIAGNOSIS — H53143 Visual discomfort, bilateral: Secondary | ICD-10-CM | POA: Diagnosis not present

## 2017-10-16 DIAGNOSIS — N528 Other male erectile dysfunction: Secondary | ICD-10-CM

## 2017-10-16 DIAGNOSIS — Z8739 Personal history of other diseases of the musculoskeletal system and connective tissue: Secondary | ICD-10-CM | POA: Diagnosis not present

## 2017-10-16 DIAGNOSIS — Z0000 Encounter for general adult medical examination without abnormal findings: Secondary | ICD-10-CM

## 2017-10-16 DIAGNOSIS — E785 Hyperlipidemia, unspecified: Secondary | ICD-10-CM

## 2017-10-16 DIAGNOSIS — I10 Essential (primary) hypertension: Secondary | ICD-10-CM | POA: Diagnosis not present

## 2017-10-16 DIAGNOSIS — H548 Legal blindness, as defined in USA: Secondary | ICD-10-CM | POA: Insufficient documentation

## 2017-10-16 MED ORDER — SILDENAFIL CITRATE 20 MG PO TABS: ORAL_TABLET | ORAL | 0 refills | Status: DC

## 2017-10-16 MED ORDER — ATORVASTATIN CALCIUM 20 MG PO TABS
20.0000 mg | ORAL_TABLET | Freq: Every day | ORAL | 1 refills | Status: DC
Start: 2017-10-16 — End: 2019-07-28

## 2017-10-16 MED ORDER — LISINOPRIL 20 MG PO TABS: 20.0000 mg | ORAL_TABLET | Freq: Every day | ORAL | 1 refills | Status: AC

## 2017-10-16 NOTE — Patient Instructions (Addendum)
FOLLOW-UP INSTRUCTIONS When: 4-6 weeks For: BP management What to bring: medications   [name],  It was a pleasure to meet you today.  For your BP, please start taking lisinopril 20mg  once a day. You can pick this up from [facility]. A 90-day supply should cost $10.  For your cholesterol, please start taking atorvastatin 20mg  once a day. [facility]. A 90-day supply should cost $4.  For your eyes, please schedule an appointment to see an eye doctor.  I am giving you a paper prescription of sildenafil 20mg . Please take 1-2 tablets as needed for sexual activity. You can take this paper prescription to different stores to compare the price.

## 2017-10-16 NOTE — Assessment & Plan Note (Signed)
Assessment Right worse than left. Associated with photophobia. Previously was given a list of ophthalmologists for the same issue, but he never followed up. He did buy an OTC multivitamin, Ocuvite, and has been taking this daily.  Plan - List of ophthalmologists provided - Encouraged patient to follow up with ophthalmology

## 2017-10-16 NOTE — Progress Notes (Signed)
   CC: re-establish care and management of HTN  HPI:  [name] is a 77 y.o. male with PMH significant for HTN and HLD who presents to re-establish care.  He has not been seen in our clinic since 05/2015. Since that time, he reports he has been doing well. He was previously on aspirin, lisinopril, and sildenafil. He has not taken any of these medications in approximately one year. He denies any complaints, including headache, chest pain, short of breath, lower extremity edema, or dysuria. He has been watching his diet, trying to cook for himself more and eat more vegetables. He does not add salt to his foods. He has also been walking more.  For the last few months he notices blurry vision and photophobia, right worse than left. The last time he was here, he was given a list of ophthalmologists, however he states he never followed up with one. He did buy an over-the-counter multivitamin, Ocuvite, and has been taking this regularly.  He denies smoking. He drinks alcohol approximately 2 times per month - his drink of choice includes rum and Coke or scotch. He denies other illicit drug use. He denies history of stroke or CAD.  Past Medical History:  Diagnosis Date  . Gout   . Hyperlipidemia   . Hypertension   . Positive PPD    History of   Review of Systems:   GEN: Negative for fevers NEURO: Negative for headaches. EYES: Positive for blurry vision, right worse than left. CV: Negative for chest pain or palpitations PULM: Negative for shortness of breath  Physical Exam:  Vitals:   10/16/17 1441 10/16/17 1537  BP: (!) 158/77 (!) 185/80  Pulse: 65   Temp: 98.2 F (36.8 C)   TempSrc: Oral   SpO2: 96%   Weight: 199 lb 14.4 oz (90.7 kg)    GEN: Sitting in chair comfortably in NAD EYES: PERRL. MMM, no visible lesions CV: NR & RR, no m/r/g PULM: CTAB, no wheezes or rhonchi MSK: No LE edema  Assessment & Plan:   See Encounters Tab for problem based charting.  Patient  discussed with Dr. Cyndie ChimeGranfortuna

## 2017-10-16 NOTE — Assessment & Plan Note (Addendum)
Assessment Has had a hard time getting/having an erection. This is a chronic issue. No chest pain or cardiac disease. He does have HTN. In 2016, he was recommended to try viagra. However, he states that he never actually tried it because it was very expensive.  Plan - Trial Viagra 20mg , 1-2 tablets as needed for sexual activity (paper prescription provided)

## 2017-10-16 NOTE — Assessment & Plan Note (Signed)
Declined flu shot and pneumonia vaccine today.

## 2017-10-16 NOTE — Assessment & Plan Note (Addendum)
Assessment BP 158/77. Repeat 185/80. Cr in 02/2016 was slightly elevated to 1.67. He was previously on lisinopril 20 mg daily, however he states he has not taken this in a year. He was on lisinopril-HCTZ prior to that but HCTZ was discontinued due to history of gout. He denies headaches, chest pain, or shortness of breath. He does endorse some blurry vision has been going on for a while. We will discontinue baby aspirin, given that he has had no history of stroke or CAD.  Plan - Restart lisinopril 20mg  daily (Rx sent to [facility] - 90 day supply is $10) - Check CMP today - Referral to ophthalmology placed - Follow up in 4-6 weeks - Discontinue aspirin 81mg  daily

## 2017-10-16 NOTE — Assessment & Plan Note (Addendum)
Assessment Previously prescribed lipitor and pravachol, however he states he never took these medications because they were too expensive. LDL was 116 in 2016.  Plan - Start atorvastatin 20mg  QHS ([facility] $[facility]) - Check CMP today

## 2017-10-16 NOTE — Progress Notes (Signed)
Medicine attending: Medical history, presenting problems, physical findings, and medications, reviewed with resident physician Dr Scherrie GerlachJennifer Huang on the day of the patient visit and I concur with her evaluation and management plan. Pt w HTN & gout who stopped all his meds over a year ago. We will resume and monitor. Renew database w pertinent labs.

## 2017-10-17 ENCOUNTER — Encounter: Payer: Self-pay | Admitting: Internal Medicine

## 2017-10-17 LAB — CMP14 + ANION GAP
A/G RATIO: 1.6 (ref 1.2–2.2)
ALT: 20 IU/L (ref 0–44)
AST: 19 IU/L (ref 0–40)
Albumin: 4.2 g/dL (ref 3.5–4.8)
Alkaline Phosphatase: 64 IU/L (ref 39–117)
Anion Gap: 14 mmol/L (ref 10.0–18.0)
BILIRUBIN TOTAL: 0.2 mg/dL (ref 0.0–1.2)
BUN/Creatinine Ratio: 14 (ref 10–24)
BUN: 14 mg/dL (ref 8–27)
CALCIUM: 9.2 mg/dL (ref 8.6–10.2)
CO2: 25 mmol/L (ref 20–29)
Chloride: 100 mmol/L (ref 96–106)
Creatinine, Ser: 0.99 mg/dL (ref 0.76–1.27)
GFR calc Af Amer: 85 mL/min/{1.73_m2} (ref >59)
GFR, EST NON AFRICAN AMERICAN: 74 mL/min/{1.73_m2} (ref >59)
GLUCOSE: 92 mg/dL (ref 65–99)
Globulin, Total: 2.6 g/dL (ref 1.5–4.5)
POTASSIUM: 4.4 mmol/L (ref 3.5–5.2)
Sodium: 139 mmol/L (ref 134–144)
Total Protein: 6.8 g/dL (ref 6.0–8.5)

## 2018-01-01 DIAGNOSIS — H401133 Primary open-angle glaucoma, bilateral, severe stage: Secondary | ICD-10-CM | POA: Diagnosis not present

## 2018-01-01 DIAGNOSIS — H2513 Age-related nuclear cataract, bilateral: Secondary | ICD-10-CM | POA: Diagnosis not present

## 2018-01-08 DIAGNOSIS — H2513 Age-related nuclear cataract, bilateral: Secondary | ICD-10-CM | POA: Diagnosis not present

## 2018-01-08 DIAGNOSIS — H401133 Primary open-angle glaucoma, bilateral, severe stage: Secondary | ICD-10-CM | POA: Diagnosis not present

## 2018-01-13 NOTE — Congregational Nurse Program (Signed)
Congregational Nurse Program Note  Date of Encounter: 01/13/2018  Past Medical History: Past Medical History:  Diagnosis Date  . Gout   . Hyperlipidemia   . Hypertension   . Positive PPD    History of    Encounter Details: CNP Questionnaire - 01/13/18 1920      Questionnaire   Patient Status  Not Applicable    Race  Black or African American    Location Patient Served At  Not Applicable    Insurance  Medicare    Uninsured  Not Applicable    Food  No food insecurities    Housing/Utilities  Yes, have permanent housing    Transportation  No transportation needs    Interpersonal Safety  Yes, feel physically and emotionally safe where you currently live    Medication  No medication insecurities    Medical Provider  Yes    Referrals  Primary Care Provider/Clinic    ED Visit Averted  Not Applicable    Life-Saving Intervention Made  Not Applicable      Client in to ask with assistance in putting his eye drops in his eyes that has been prescribed by Dr Dione BoozeGroat. Nurse assisted client ,will return until he feel secure about putting drops in.

## 2018-01-14 NOTE — Congregational Nurse Program (Signed)
Congregational Nurse Program Note  Date of Encounter: 01/14/2018  Past Medical History: Past Medical History:  Diagnosis Date  . Gout   . Hyperlipidemia   . Hypertension   . Positive PPD    History of    Encounter Details: CNP Questionnaire - 01/14/18 1923      Questionnaire   Patient Status  Not Applicable    Race  Black or African American    Location Patient Served At  Not Applicable    Insurance  Medicare    Uninsured  Not Applicable    Food  No food insecurities    Housing/Utilities  Yes, have permanent housing    Transportation  No transportation needs    Interpersonal Safety  Yes, feel physically and emotionally safe where you currently live    Medication  Provided medication assistance    Medical Provider  Yes    Referrals  Primary Care Provider/Clinic    ED Visit Averted  Not Applicable    Life-Saving Intervention Made  Not Applicable     Nurse assisted client with one drop of Latanoprost administered in each eye as he has no one to assist him and until he feels secure will [facility].

## 2018-02-17 NOTE — Congregational Nurse Program (Signed)
Congregational Nurse Program Note  Date of Encounter: 02/17/2018  Past Medical History: Past Medical History:  Diagnosis Date  . Gout   . Hyperlipidemia   . Hypertension   . Positive PPD    History of    Encounter Details: CNP Questionnaire - 02/17/18 1846      Questionnaire   Patient Status  Not Applicable    Race  Black or African American    Location Patient Served At  Not Applicable    Insurance  Medicare    Uninsured  Not Applicable    Food  No food insecurities    Housing/Utilities  Yes, have permanent housing    Transportation  No transportation needs    Interpersonal Safety  Yes, feel physically and emotionally safe where you currently live    Medication  Yes, have medication insecurities;Provided medication assistance    Medical Provider  Yes    Referrals  Primary Care Provider/Clinic    ED Visit Averted  Not Applicable    Life-Saving Intervention Made  Not Applicable     In requesting assistance with eye drops. Nurse dropped one drop of his medication in each eye. Counseled regarding importance of returning to see his eye MD for follow up . He has rescheduled his appointment for later is concerned he cant afford the drops right now . Nurse will assist him in applying for medicare part D to see if he is eligiable for assistance with med's Return tomorrow for drops .

## 2018-02-18 NOTE — Congregational Nurse Program (Signed)
Congregational Nurse Program Note  Date of Encounter: 02/18/2018  Past Medical History: Past Medical History:  Diagnosis Date  . Gout   . Hyperlipidemia   . Hypertension   . Positive PPD    History of    Encounter Details: CNP Questionnaire - 02/18/18 1703      Questionnaire   Patient Status  Not Applicable    Race  Black or African American    Location Patient Served At  Not Applicable    Insurance  Medicare    Uninsured  Not Applicable    Food  No food insecurities    Housing/Utilities  Yes, have permanent housing    Transportation  No transportation needs    Interpersonal Safety  Yes, feel physically and emotionally safe where you currently live    Medication  Yes, have medication insecurities;Provided medication assistance    Referrals  Area Agency;Medication Assistance    ED Visit Averted  Not Applicable    Life-Saving Intervention Made  Not Applicable      Client in today and eye drops 1 in each eye placed . Client not able to afford Combigan 1 drop in both eyes twice a day ,only taking Latanoprost 1 drop both eyes . Tc to medicare office to see if he is eligible for Medicare part D ,instructions given for him to check with state of [place] ,Nurse gave client SHIP application to apply for assistance and he will call medication assistance number given by medicare office to see if he can apply  For extra help.Client not taking all of his medications ,unable  To put drops in himself because he doesn't want to waste his medications . Supportive and will assist as I can. Return next week

## 2018-03-22 ENCOUNTER — Encounter: Payer: Self-pay | Admitting: *Deleted

## 2018-05-21 DIAGNOSIS — H401133 Primary open-angle glaucoma, bilateral, severe stage: Secondary | ICD-10-CM | POA: Diagnosis not present

## 2019-01-07 ENCOUNTER — Other Ambulatory Visit: Payer: Self-pay

## 2019-01-07 ENCOUNTER — Encounter: Payer: Self-pay | Admitting: Internal Medicine

## 2019-03-23 ENCOUNTER — Encounter: Payer: Self-pay | Admitting: *Deleted

## 2019-04-21 ENCOUNTER — Telehealth: Payer: Self-pay

## 2019-04-21 NOTE — Telephone Encounter (Signed)
Error

## 2019-05-03 ENCOUNTER — Other Ambulatory Visit: Payer: Self-pay

## 2019-05-03 ENCOUNTER — Ambulatory Visit (INDEPENDENT_AMBULATORY_CARE_PROVIDER_SITE_OTHER): Payer: Medicare Other | Admitting: Internal Medicine

## 2019-05-03 ENCOUNTER — Encounter: Payer: Self-pay | Admitting: Internal Medicine

## 2019-05-03 VITALS — BP 197/100 | HR 77 | Temp 98.9°F | Ht 68.0 in | Wt 213.1 lb

## 2019-05-03 DIAGNOSIS — N529 Male erectile dysfunction, unspecified: Secondary | ICD-10-CM

## 2019-05-03 DIAGNOSIS — Z79899 Other long term (current) drug therapy: Secondary | ICD-10-CM

## 2019-05-03 DIAGNOSIS — I10 Essential (primary) hypertension: Secondary | ICD-10-CM

## 2019-05-03 DIAGNOSIS — N528 Other male erectile dysfunction: Secondary | ICD-10-CM

## 2019-05-03 MED ORDER — AMLODIPINE BESY-BENAZEPRIL HCL 10-20 MG PO CAPS: 1.0000 | ORAL_CAPSULE | Freq: Every day | ORAL | 11 refills | Status: DC

## 2019-05-03 MED ORDER — SILDENAFIL CITRATE 20 MG PO TABS: ORAL_TABLET | ORAL | 0 refills | Status: DC

## 2019-05-03 NOTE — Assessment & Plan Note (Signed)
Erectile dysfunction: He states that he does not use medication as often and has recently had a consistent relationship.  Plan: -Refill for sildenafil given

## 2019-05-03 NOTE — Progress Notes (Signed)
   CC: Follow-up hypertension  HPI:  [name] is a 78 y.o. gentleman with medical history listed below presenting to follow-up on hypertension and medication refill.  Please see problem based charting for further details.  Past Medical History:  Diagnosis Date  . Gout   . Hyperlipidemia   . Hypertension   . Positive PPD    History of   Review of Systems: As per HPI  Physical Exam:  Vitals:   05/03/19 1503  BP: (!) 197/100  Pulse: 77  Temp: 98.9 F (37.2 C)  TempSrc: Oral  SpO2: 96%  Weight: 213 lb 1.6 oz (96.7 kg)  Height: 5\' 8"  (1.727 m)   Physical Exam  Constitutional: He is well-developed, well-nourished, and in no distress.  HENT:  Head: Normocephalic and atraumatic.  Eyes: Conjunctivae are normal.  Cardiovascular: Normal rate, regular rhythm and normal heart sounds.  No murmur heard. Pulmonary/Chest: Effort normal and breath sounds normal. He has no wheezes. He has no rales.  Abdominal: Distention: Due to body habitus.    Assessment & Plan:   See Encounters Tab for problem based charting.  Patient discussed with Dr. Rebeca Alert

## 2019-05-03 NOTE — Assessment & Plan Note (Signed)
Hypertension-uncontrolled: Austin Huerta has a longstanding history of hypertension and today he states that he went to pick up his medication at [facility] and was told he needed a refill.  He is supposed to take lisinopril 20 mg daily however states that he has not taken his medication for over 7 months now.  His blood pressure today is elevated at 197/100 however he denies headaches, nausea, visual vomiting, dizziness, lightheadedness, chest pain, shortness of breath, abdominal pain.  He tells me today that he is eating "decent meals, "and continues to exercise as he walks to take the city bus.  BP Readings from Last 3 Encounters:  05/03/19 (!) 197/100  10/16/17 (!) 185/80  03/19/16 109/72   Plan: - Start amlodipine-benazepril 10-20 mg daily -Follow-up clinic in 1 to 2 weeks for blood pressure recheck -Follow-up CMP

## 2019-05-03 NOTE — Patient Instructions (Addendum)
[name],   It was a pleasure taking care of you in the clinic today.  I am starting you on a new blood pressure medication called Lotrel.  This is a combination of 2 blood pressure medicines and I would like for you to take 1 pill every day.  I would also like for you to continue checking your blood pressure and decrease salt intake as well as exercising daily.  I would like to see you back in 1 to 2 weeks for blood pressure recheck.  Take Care! Dr. Eileen Stanford  Please call the internal medicine center clinic if you have any questions or concerns, we may be able to help and keep you from a long and expensive emergency room wait. Our clinic and after hours phone number is [phone], the best time to call is Monday through Friday 9 am to 4 pm but there is always someone available 24/7 if you have an emergency. If you need medication refills please notify your pharmacy one week in advance and they will send [place] a request.

## 2019-05-04 ENCOUNTER — Other Ambulatory Visit: Payer: Self-pay | Admitting: *Deleted

## 2019-05-04 ENCOUNTER — Telehealth: Payer: Self-pay | Admitting: Internal Medicine

## 2019-05-04 LAB — CMP14 + ANION GAP
ALT: 15 IU/L (ref 0–44)
AST: 23 IU/L (ref 0–40)
Albumin/Globulin Ratio: 1.3 (ref 1.2–2.2)
Albumin: 4 g/dL (ref 3.7–4.7)
Alkaline Phosphatase: 77 IU/L (ref 39–117)
Anion Gap: 16 mmol/L (ref 10.0–18.0)
BUN/Creatinine Ratio: 13 (ref 10–24)
BUN: 13 mg/dL (ref 8–27)
Bilirubin Total: 0.3 mg/dL (ref 0.0–1.2)
CO2: 22 mmol/L (ref 20–29)
Calcium: 9.3 mg/dL (ref 8.6–10.2)
Chloride: 104 mmol/L (ref 96–106)
Creatinine, Ser: 1.04 mg/dL (ref 0.76–1.27)
GFR calc Af Amer: 80 mL/min/{1.73_m2} (ref >59)
GFR calc non Af Amer: 69 mL/min/{1.73_m2} (ref >59)
Globulin, Total: 3 g/dL (ref 1.5–4.5)
Glucose: 94 mg/dL (ref 65–99)
Potassium: 4.4 mmol/L (ref 3.5–5.2)
Sodium: 142 mmol/L (ref 134–144)
Total Protein: 7 g/dL (ref 6.0–8.5)

## 2019-05-04 MED ORDER — SILDENAFIL CITRATE 20 MG PO TABS: ORAL_TABLET | ORAL | 0 refills | Status: DC

## 2019-05-04 NOTE — Progress Notes (Signed)
Internal Medicine Clinic Attending  Case discussed with Dr. Agyei at the time of the visit.  We reviewed the resident's history and exam and pertinent patient test results.  I agree with the assessment, diagnosis, and plan of care documented in the resident's note.  Alexander Raines, M.D., Ph.D.  

## 2019-05-04 NOTE — Telephone Encounter (Signed)
I called Austin Huerta to discuss his complete metabolic panel.  Overall is unremarkable.  He expressed understanding and gratitude.

## 2019-07-28 ENCOUNTER — Ambulatory Visit (INDEPENDENT_AMBULATORY_CARE_PROVIDER_SITE_OTHER): Payer: Medicare Other | Admitting: Internal Medicine

## 2019-07-28 ENCOUNTER — Encounter: Payer: Self-pay | Admitting: Internal Medicine

## 2019-07-28 VITALS — BP 125/60 | HR 66 | Temp 98.4°F | Ht 68.0 in | Wt 215.7 lb

## 2019-07-28 DIAGNOSIS — E785 Hyperlipidemia, unspecified: Secondary | ICD-10-CM | POA: Diagnosis not present

## 2019-07-28 DIAGNOSIS — H538 Other visual disturbances: Secondary | ICD-10-CM

## 2019-07-28 DIAGNOSIS — I10 Essential (primary) hypertension: Secondary | ICD-10-CM

## 2019-07-28 DIAGNOSIS — Z79899 Other long term (current) drug therapy: Secondary | ICD-10-CM

## 2019-07-28 LAB — BASIC METABOLIC PANEL
Anion gap: 10 (ref 5–15)
BUN: 17 mg/dL (ref 8–23)
CO2: 26 mmol/L (ref 22–32)
Calcium: 9.5 mg/dL (ref 8.9–10.3)
Chloride: 101 mmol/L (ref 98–111)
Creatinine, Ser: 1.18 mg/dL (ref 0.61–1.24)
GFR calc Af Amer: >60 mL/min (ref >60)
GFR calc non Af Amer: 59 mL/min — ABNORMAL LOW (ref >60)
Glucose, Bld: 96 mg/dL (ref 70–99)
Potassium: 4.6 mmol/L (ref 3.5–5.1)
Sodium: 137 mmol/L (ref 135–145)

## 2019-07-28 NOTE — Assessment & Plan Note (Signed)
Patient reports seeing opthalmology and being given eye drops. He could not afford eye drops, but changed his prescription plan. He will reschedule eye appointment soon. No acute changes in vision.

## 2019-07-28 NOTE — Assessment & Plan Note (Signed)
78 years old. Patient is not currently taking his statin prescribed previously. We calculated his ASCVD score (28%) and discussed this score is appropriate until age 63.  We discussed his risk of hypertension and age. Patients has no history of heart disease or stroke. His decision was not to take the statin at this time and continue eating healthy.

## 2019-07-28 NOTE — Patient Instructions (Signed)
Thank you for trusting me with your care. To recap, today we discussed the following:   High blood pressure - Continue your amlodipine/benazepril 10-20 mg - Today we will check your basic metabolic panel  Cholesterol - We discussed your risk and you decided not to take atorvastatin   My best,  Tamsen Snider, MD

## 2019-07-28 NOTE — Progress Notes (Signed)
   CC: high blood pressure  HPI:  [name] is a 78 y.o. with past medical history below who presents with high blood pressure. Please see problem based charting for details of presentation, assessment, and plan.    Past Medical History:  Diagnosis Date  . Gout   . Hyperlipidemia   . Hypertension   . Positive PPD    History of   Review of Systems:  Review of Systems  Constitutional: Negative for chills and fever.  Eyes: Positive for blurred vision. Negative for pain.  Psychiatric/Behavioral: Negative for depression. The patient is not nervous/anxious.      Physical Exam:  Vitals:   07/28/19 1520  BP: 125/60  Pulse: 66  Temp: 98.4 F (36.9 C)  TempSrc: Oral  SpO2: 99%  Weight: 215 lb 11.2 oz (97.8 kg)  Height: 5\' 8"  (1.727 m)   Physical Exam Constitutional:      Appearance: Normal appearance. He is not ill-appearing.  Cardiovascular:     Rate and Rhythm: Normal rate and regular rhythm.     Heart sounds: No murmur. No friction rub. No gallop.   Pulmonary:     Effort: Pulmonary effort is normal.     Breath sounds: No wheezing, rhonchi or rales.  Abdominal:     General: Bowel sounds are normal.     Palpations: Abdomen is soft.      Assessment & Plan:   See Encounters Tab for problem based charting.  Patient seen with Dr. Lynnae January

## 2019-07-30 NOTE — Progress Notes (Signed)
Internal Medicine Clinic Attending  I saw and evaluated the patient.  I personally confirmed the key portions of the history and exam documented by Dr. Steen and I reviewed pertinent patient test results.  The assessment, diagnosis, and plan were formulated together and I agree with the documentation in the resident's note.     

## 2020-05-04 ENCOUNTER — Other Ambulatory Visit: Payer: Self-pay | Admitting: Internal Medicine

## 2020-05-04 DIAGNOSIS — I10 Essential (primary) hypertension: Secondary | ICD-10-CM

## 2020-05-12 ENCOUNTER — Encounter: Payer: Medicare Other | Admitting: Internal Medicine

## 2020-05-15 ENCOUNTER — Encounter: Payer: Medicare Other | Admitting: Internal Medicine

## 2020-05-17 ENCOUNTER — Encounter: Payer: Self-pay | Admitting: Internal Medicine

## 2020-05-17 ENCOUNTER — Ambulatory Visit (INDEPENDENT_AMBULATORY_CARE_PROVIDER_SITE_OTHER): Payer: Medicare Other | Admitting: Internal Medicine

## 2020-05-17 ENCOUNTER — Other Ambulatory Visit: Payer: Self-pay

## 2020-05-17 VITALS — BP 145/75 | HR 84 | Temp 99.1°F | Ht 68.0 in | Wt 214.3 lb

## 2020-05-17 DIAGNOSIS — H538 Other visual disturbances: Secondary | ICD-10-CM

## 2020-05-17 DIAGNOSIS — I10 Essential (primary) hypertension: Secondary | ICD-10-CM

## 2020-05-17 DIAGNOSIS — N528 Other male erectile dysfunction: Secondary | ICD-10-CM | POA: Diagnosis not present

## 2020-05-17 MED ORDER — SILDENAFIL CITRATE 20 MG PO TABS: ORAL_TABLET | ORAL | 0 refills | Status: DC

## 2020-05-17 MED ORDER — AMLODIPINE BESY-BENAZEPRIL HCL 10-20 MG PO CAPS: 1.0000 | ORAL_CAPSULE | Freq: Every day | ORAL | 3 refills | Status: DC

## 2020-05-17 NOTE — Assessment & Plan Note (Signed)
Patient has reestablish care with Dr. Alden Hipp. He says he is being followed for cataracts and glaucoma.

## 2020-05-17 NOTE — Patient Instructions (Addendum)
Thank you for trusting me with your care. To recap, today we discussed the following:  1. Essential hypertension BP Readings from Last 3 Encounters:  05/17/20 (!) 145/75  07/28/19 125/60   We will not make changes today. You were off of your medication for a few days and I expect your blood pressure is a goal. I will call you tomorrow to update you on the results of your lab work.    - amLODipine-benazepril (LOTREL) 10-20 MG capsule; Take 1 capsule by mouth daily.  Dispense: 90 capsule; Refill: 3 - BMP8+Anion Gap  2.  male erectile dysfunction - sildenafil (REVATIO) 20 MG tablet; Take 1-2 tablets as needed for sexual activity  Dispense: 20 tablet; Refill: 0

## 2020-05-17 NOTE — Progress Notes (Signed)
   CC: high bloo  HPI:[name] is a 79 y.o. male who presents for evaluation of essential hypertension and erectile dysfunction. Please see individual problem based A/P for details.   Past Medical History:  Diagnosis Date  . Gout   . Hyperlipidemia   . Hypertension   . Positive PPD    History of   Review of Systems:   Review of Systems  Constitutional: Negative for chills and fever.  Eyes: Positive for blurred vision. Negative for pain.  Psychiatric/Behavioral: Negative for depression and memory loss.     Physical Exam: Vitals:   05/17/20 1427  BP: (!) 145/75  Pulse: 84  Temp: 99.1 F (37.3 C)  TempSrc: Oral  SpO2: 96%  Weight: 214 lb 4.8 oz (97.2 kg)  Height: 5\' 8"  (1.727 m)   General: NAD, nl appearance Cardiovascular: Normal rate, regular rhythm.  No murmurs, rubs, or gallops Pulmonary : Equal breath sounds, No wheezes, rales, or rhonchi Abdominal: soft, nontender,  bowel sounds present    Assessment & Plan:   See Encounters Tab for problem based charting.  Patient discussed with Dr. 

## 2020-05-17 NOTE — Progress Notes (Signed)
Internal Medicine Clinic Attending  Case discussed with Dr. Steen  At the time of the visit.  We reviewed the resident's history and exam and pertinent patient test results.  I agree with the assessment, diagnosis, and plan of care documented in the resident's note.  

## 2020-05-17 NOTE — Assessment & Plan Note (Signed)
Hypertension: Patient's BP today is 145/75 with a goal of <140/80. BP was 125/60 at previous visit The patient was recently out of his medication , but  endorses adherence to his medication regimen on most days. He denied chest pain, headache, visual changes, lightheadedness, weakness, dizziness on standing, swelling in the feet or ankles. Most recent renal function per BMP as below. BMP Latest Ref Rng & Units 07/28/2019 05/03/2019 10/16/2017  Glucose 70 - 99 mg/dL 96 94 92  BUN 8 - 23 mg/dL 17 13 14   Creatinine 0.61 - 1.24 mg/dL 8.82 8.00  BUN/Creat Ratio 10 - 24 - 13 14  Sodium 135 - 145 mmol/L 137 142 139  Potassium 3.5 - 5.1 mmol/L 4.6 4.4 4.4  Chloride 98 - 111 mmol/L 101 104 100  CO2 22 - 32 mmol/L 26 22 25   Calcium 8.9 - 10.3 mg/dL 9.5 9.3 9.2   Medication changes: No  Plan: Continue amlodipine benazepril 10-20 mg, 1 tablet daily BMP today

## 2020-05-18 LAB — BMP8+ANION GAP
Anion Gap: 15 mmol/L (ref 10.0–18.0)
BUN/Creatinine Ratio: 21 (ref 10–24)
BUN: 23 mg/dL (ref 8–27)
CO2: 22 mmol/L (ref 20–29)
Calcium: 9.6 mg/dL (ref 8.6–10.2)
Chloride: 105 mmol/L (ref 96–106)
Creatinine, Ser: 1.11 mg/dL (ref 0.76–1.27)
GFR calc Af Amer: 73 mL/min/{1.73_m2} (ref >59)
GFR calc non Af Amer: 63 mL/min/{1.73_m2} (ref >59)
Glucose: 106 mg/dL — ABNORMAL HIGH (ref 65–99)
Potassium: 4.5 mmol/L (ref 3.5–5.2)
Sodium: 142 mmol/L (ref 134–144)

## 2020-06-28 NOTE — Congregational Nurse Program (Signed)
°  Dept: [phone]   Congregational Nurse Program Note  Date of Encounter: 06/21/2020  Past Medical History: Past Medical History:  Diagnosis Date   Gout    Hyperlipidemia    Hypertension    Positive PPD    History of    Encounter Details:  CNP Questionnaire - 06/21/20 1400      Questionnaire   Visit Setting Other    Location Patient Served At Not Applicable    Patient Status Not Applicable    Medical Provider Yes    Insurance Medicare    Intervention Support         flu shot given

## 2020-08-11 ENCOUNTER — Other Ambulatory Visit: Payer: Self-pay | Admitting: Student

## 2020-08-11 DIAGNOSIS — I10 Essential (primary) hypertension: Secondary | ICD-10-CM

## 2021-07-13 ENCOUNTER — Other Ambulatory Visit: Payer: Self-pay | Admitting: Internal Medicine

## 2021-07-13 DIAGNOSIS — I10 Essential (primary) hypertension: Secondary | ICD-10-CM

## 2021-07-16 NOTE — Telephone Encounter (Signed)
Attempted to contact patient, but no answer and voicemail is not set up.  Appointment has been scheduled for 07/25/21 at 2:15 pm with Dr. Barbaraann Faster and mailed to current address on file.

## 2021-07-20 ENCOUNTER — Telehealth (INDEPENDENT_AMBULATORY_CARE_PROVIDER_SITE_OTHER): Payer: Medicare Other | Admitting: Internal Medicine

## 2021-07-20 DIAGNOSIS — I10 Essential (primary) hypertension: Secondary | ICD-10-CM

## 2021-07-20 MED ORDER — AMLODIPINE BESY-BENAZEPRIL HCL 10-20 MG PO CAPS: 1.0000 | ORAL_CAPSULE | Freq: Every day | ORAL | 0 refills | Status: DC

## 2021-07-20 NOTE — Telephone Encounter (Signed)
Patient called to request refill on BP medication and would like to change the date of his appointment with PCP. Will forward to front desk to call patient on Monday 10/31 to reschedule his upcoming appointment.

## 2021-07-25 ENCOUNTER — Encounter: Payer: Medicare Other | Admitting: Internal Medicine

## 2021-08-28 ENCOUNTER — Ambulatory Visit (INDEPENDENT_AMBULATORY_CARE_PROVIDER_SITE_OTHER): Payer: Medicare Other | Admitting: Internal Medicine

## 2021-08-28 ENCOUNTER — Encounter: Payer: Self-pay | Admitting: Internal Medicine

## 2021-08-28 ENCOUNTER — Other Ambulatory Visit: Payer: Self-pay | Admitting: Internal Medicine

## 2021-08-28 VITALS — BP 125/69 | HR 73 | Temp 97.7°F | Wt 203.4 lb

## 2021-08-28 DIAGNOSIS — N528 Other male erectile dysfunction: Secondary | ICD-10-CM | POA: Diagnosis not present

## 2021-08-28 DIAGNOSIS — I10 Essential (primary) hypertension: Secondary | ICD-10-CM | POA: Diagnosis not present

## 2021-08-28 DIAGNOSIS — Z23 Encounter for immunization: Secondary | ICD-10-CM | POA: Diagnosis not present

## 2021-08-28 DIAGNOSIS — Z0000 Encounter for general adult medical examination without abnormal findings: Secondary | ICD-10-CM

## 2021-08-28 MED ORDER — SILDENAFIL CITRATE 20 MG PO TABS: ORAL_TABLET | ORAL | 0 refills | Status: DC

## 2021-08-28 MED ORDER — AMLODIPINE BESY-BENAZEPRIL HCL 10-20 MG PO CAPS: 1.0000 | ORAL_CAPSULE | Freq: Every day | ORAL | 3 refills | Status: DC

## 2021-08-28 NOTE — Progress Notes (Signed)
   CC: hypertension , erectile dysfunction, and need for influenza vaccine  HPI:[name] is a 80 y.o. male who presents for evaluation of hypertension and need for influenza vaccine. Please see individual problem based A/P for details.  Past Medical History:  Diagnosis Date   Gout    Hyperlipidemia    Hypertension    Positive PPD    History of   Review of Systems:   Review of Systems  Cardiovascular:  Negative for chest pain and leg swelling.  Neurological:  Negative for dizziness and headaches.    Physical Exam: Vitals:   08/28/21 1458  BP: 125/69  Pulse: 73  Temp: 97.7 F (36.5 C)  TempSrc: Oral  SpO2: 97%  Weight: 203 lb 6.4 oz (92.3 kg)   General: well appearing , 80 year old man dressed in shirt and tie Cardiovascular: Normal rate, regular rhythm.  No murmurs, rubs, or gallops Pulmonary : Equal breath sounds, No wheezes, rales, or rhonchi Ext: No edema in lower extremities, no tenderness to palpation of lower extremities.   Assessment & Plan:   See Encounters Tab for problem based charting.  Patient discussed with Dr. Antony Contras

## 2021-08-28 NOTE — Patient Instructions (Signed)
Thank you for trusting me with your care. To recap, today we discussed the following:   1. Essential hypertension - BMP8+Anion Gap - amLODipine-benazepril (LOTREL) 10-20 MG capsule; Take 1 capsule by mouth daily.  Dispense: 90 capsule; Refill: 3

## 2021-08-29 LAB — BMP8+ANION GAP
Anion Gap: 13 mmol/L (ref 10.0–18.0)
BUN/Creatinine Ratio: 14 (ref 10–24)
BUN: 18 mg/dL (ref 8–27)
CO2: 24 mmol/L (ref 20–29)
Calcium: 9.2 mg/dL (ref 8.6–10.2)
Chloride: 101 mmol/L (ref 96–106)
Creatinine, Ser: 1.33 mg/dL — ABNORMAL HIGH (ref 0.76–1.27)
Glucose: 84 mg/dL (ref 70–99)
Potassium: 4.9 mmol/L (ref 3.5–5.2)
Sodium: 138 mmol/L (ref 134–144)
eGFR: 54 mL/min/{1.73_m2} — ABNORMAL LOW (ref >59)

## 2021-08-30 ENCOUNTER — Encounter: Payer: Self-pay | Admitting: Internal Medicine

## 2021-08-30 NOTE — Assessment & Plan Note (Signed)
No side effects from use of medication Request refill today.  Assessment/Plan: male erectile dysfunction - sildenafil (REVATIO) 20 MG tablet; Take 1-2 tablets as needed for sexual activity  Dispense: 20 tablet; Refill: 0

## 2021-08-30 NOTE — Assessment & Plan Note (Signed)
RJJ:OACZYSA'Y BP today is 125/69  with a goal of <140/80. The patient endorses adherence to his medication regimen and denied any hypotension.   Assessment/Plan: Hypertension, chronic problem, well controlled Continue amlodipine benazepril 10 -20 , 1 tablet daily - BMP

## 2021-08-30 NOTE — Assessment & Plan Note (Signed)
Received vaccine for Influenza today

## 2021-09-03 NOTE — Progress Notes (Signed)
Internal Medicine Clinic Attending  Case discussed with Dr. Steen  At the time of the visit.  We reviewed the resident's history and exam and pertinent patient test results.  I agree with the assessment, diagnosis, and plan of care documented in the resident's note.  

## 2022-01-18 ENCOUNTER — Ambulatory Visit (INDEPENDENT_AMBULATORY_CARE_PROVIDER_SITE_OTHER): Payer: Medicare Other | Admitting: Internal Medicine

## 2022-01-18 ENCOUNTER — Encounter: Payer: Self-pay | Admitting: Internal Medicine

## 2022-01-18 ENCOUNTER — Other Ambulatory Visit: Payer: Self-pay

## 2022-01-18 DIAGNOSIS — I10 Essential (primary) hypertension: Secondary | ICD-10-CM

## 2022-01-18 DIAGNOSIS — H548 Legal blindness, as defined in USA: Secondary | ICD-10-CM | POA: Diagnosis not present

## 2022-01-18 NOTE — Assessment & Plan Note (Addendum)
BP Readings from Last 3 Encounters:  ?01/18/22 (!) 152/68  ?08/28/21 125/69  ?05/17/20 (!) 145/75  ?  ?Patient has elevated blood pressure today in clinic. BP normal at last visit and reports taking amlodipine- benazepril as prescribed. Will not make changes today.  ? ?Plan ?- Continue amlodipine benazepril 10-20 ?- Follow up in 3 months , recommend BMP at this visit ? ?
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
## 2022-01-24 NOTE — Progress Notes (Signed)
Internal Medicine Clinic Attending  Case discussed with Dr. Steen  At the time of the visit.  We reviewed the resident's history and exam and pertinent patient test results.  I agree with the assessment, diagnosis, and plan of care documented in the resident's note.  

## 2022-08-29 ENCOUNTER — Encounter: Payer: Medicare Other | Admitting: Internal Medicine
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
## 2022-10-14 ENCOUNTER — Ambulatory Visit: Payer: Medicare Other

## 2022-10-14 ENCOUNTER — Encounter: Payer: Medicare Other | Admitting: Student
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
## 2022-10-30 ENCOUNTER — Telehealth: Payer: Self-pay

## 2022-10-30 ENCOUNTER — Ambulatory Visit (INDEPENDENT_AMBULATORY_CARE_PROVIDER_SITE_OTHER): Payer: Medicare Other | Admitting: Licensed Clinical Social Worker

## 2022-10-30 ENCOUNTER — Ambulatory Visit (INDEPENDENT_AMBULATORY_CARE_PROVIDER_SITE_OTHER): Payer: Medicare Other | Admitting: Student

## 2022-10-30 ENCOUNTER — Encounter: Payer: Medicare Other | Admitting: Student

## 2022-10-30 DIAGNOSIS — H548 Legal blindness, as defined in USA: Secondary | ICD-10-CM

## 2022-10-30 DIAGNOSIS — Z59819 Housing instability, housed unspecified: Secondary | ICD-10-CM
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
## 2022-10-30 NOTE — Progress Notes (Signed)
[facility] contacted [facility] DSS utility assistance via secured email. Unable to get through via phone. [facility] inquired on status of Utility assistance application. [facility] will continue to follow up until response is received.   Milus Height, MSW, [facility] Direct Dial:[phone]  Fax [phone] Main Office Phone: [phone] [place], [place] [address] Website: [place], [email or web address]>  Hi Randall Hiss!  Can you assist me with an email for a Mrs. Ferguson? I am attempting to follow up on a utility assistance application for a patient Austin Huerta DOB 06-12-1941.  Thank you for any help you can offer!   Milus Height, MSW, [facility] Direct Dial:[phone]  Fax [phone] Main Office Phone: [phone] [place], [place] [address] Website: [place], [place]

## 2022-10-30 NOTE — Telephone Encounter (Signed)
Attempts to contact patient.  VM full unable to leave message.  Patient will need referral to SW.

## 2022-11-07 NOTE — BH Specialist Note (Signed)
Email from DSS   Good afternoon,   I spoke to Mr. Azuara prior to receiving your email.   He came into the agency on 1.31.24 to apply for a [employer] [place] Winter Moratorium.   I explained to him the eligibility requirements for the Moratorium established by the [employer].   His electric service has been inactive since August 2023 therefore he wouldn't meet the requirements for the Moratorium which helps prevent disconnection of service.   [employer] is requiring $1,193.99 for restoration of service in addition to a reconnection fee and a deposit may be required.   Mr. Cruces was informed of our Crisis Intervention and [employer] programs here at [facility] that could possibly assist with his outstanding bill.   I explained to him that funding from both programs would not be sufficient to cover his outstanding balance and alleviate his crisis therefore He would be responsible for paying the difference prior to [place] assisting with the bill.   As of currently we are out of funding for the Crisis Intervention Program which has a program limit of $600.00.   Hopefully, we will receive additional funding soon.   The [employer] ([employer]) provides a one time vendor payment towards the heating bill which can be $300, $400 or $500 based on the household size and income.   I offered to refer his hardship and concerns to our Aging and Adult Services Department for assistance, guidance, and additional resources but he declined.        [facility]  [address] [place], [place] [address]  [phone]    [email or web address] .gov  [email or web address]                              Milus Height, MSW, [place]  [facility] Direct [phone]  Fax  [phone] Main Office Phone: [phone] [address] [place], [place] [address] Website: [place], [place]

## 2022-11-11 NOTE — Progress Notes (Signed)
Internal Medicine Clinic Attending  Case discussed with Dr. Jodi Mourning  At the time of the visit.  We reviewed the resident's history and exam and pertinent patient test results.  I agree with the assessment, diagnosis, and plan of care documented in the resident's note.

## 2022-11-26 NOTE — Progress Notes (Signed)
   CC: Routine Follow-up  HPI:   [name] is a 82 y.o. male with a past medical history of hypertension, hyperlipidemia, and gout who presents for scheduled follow-up. He was last seen at [facility] on in 12-2021.    Past Medical History:  Diagnosis Date   Gout    Hyperlipidemia    Hypertension    Positive PPD    History of     Review of Systems:    Reports feeling good overall Denies recent illnesses, fever, chest pain, nausea, diarrhea, lightheadedness, dizziness, blurred vision, headache, palpitations   Physical Exam:  Vitals:   12/02/22 1429 12/02/22 1530  BP: (!) 156/80 (!) 157/78  Pulse: 69 63  SpO2: 98%   Weight: 202 lb 14.4 oz (92 kg)     General:   awake and alert, sitting comfortably in chair, cooperative, not in acute distress Lungs:   normal respiratory effort, breathing unlabored, symmetrical chest rise, no crackles or wheezing Cardiac:   regular rate and rhythm, normal S1 and S2 Neurologic:   oriented to person-place-time, moving all extremities, no gross focal deficits Psychiatric:   euthymic mood with congruent affect, intelligible speech    Assessment & Plan:   Essential hypertension Patient has history of hypertension managed with amlodipine-benazepril. Today in clinic, his BP was 156/80 and repeat 157/78. Although he took his medication this morning, he reports inconsistent adherence on average. He will take his medication about 5-6 times per week, in part due to financial stresses. Most recent BMP from 08-2021 revealed GFR 54, consistent with CKD IIIa. Electrolytes all within the normal range. Repeat BMP is warranted, and patient was encouraged to take his BP medication consistently. We also discussed the importance of a low-salt diet.  - Continue amlodipine-benazepril 10-20mg  q24       See Encounters Tab for problem based charting.  Patient discussed with Dr. Evette Doffing

## 2022-12-02 ENCOUNTER — Encounter: Payer: Self-pay | Admitting: *Deleted

## 2022-12-02 ENCOUNTER — Encounter: Payer: Self-pay | Admitting: Student

## 2022-12-02 ENCOUNTER — Ambulatory Visit (INDEPENDENT_AMBULATORY_CARE_PROVIDER_SITE_OTHER): Payer: Medicare Other | Admitting: *Deleted

## 2022-12-02 ENCOUNTER — Ambulatory Visit (INDEPENDENT_AMBULATORY_CARE_PROVIDER_SITE_OTHER): Payer: Medicare Other | Admitting: Student

## 2022-12-02 ENCOUNTER — Ambulatory Visit (INDEPENDENT_AMBULATORY_CARE_PROVIDER_SITE_OTHER): Payer: Medicare Other | Admitting: Licensed Clinical Social Worker

## 2022-12-02 ENCOUNTER — Encounter: Payer: Medicare Other | Admitting: Student

## 2022-12-02 VITALS — BP 157/78 | HR 63 | Wt 202.9 lb

## 2022-12-02 VITALS — BP 157/78 | HR 63 | Temp 98.0°F | Wt 202.9 lb

## 2022-12-02 DIAGNOSIS — I10 Essential (primary) hypertension: Secondary | ICD-10-CM

## 2022-12-02 DIAGNOSIS — Z0000 Encounter for general adult medical examination without abnormal findings: Secondary | ICD-10-CM | POA: Diagnosis not present

## 2022-12-02 DIAGNOSIS — Z59819 Housing instability, housed unspecified: Secondary | ICD-10-CM

## 2022-12-02 NOTE — BH Specialist Note (Unsigned)
[facility]  [place], [place]

## 2022-12-02 NOTE — Progress Notes (Signed)
Subjective:   Austin Huerta is a 82 y.o. male who presents for an Initial Medicare Annual Wellness Visit.  Review of Systems   defer to pcp        Objective:    Today's Vitals   12/02/22 1435 12/02/22 1530  BP: (!) 156/80 (!) 157/78  Pulse: 69 63  Temp: 98 F (36.7 C)   TempSrc: Oral   SpO2: 98%   Weight: 202 lb 14.4 oz (92 kg)    Body mass index is 30.85 kg/m.     12/02/2022    5:07 PM 12/02/2022    4:06 PM 12/02/2022    2:37 PM 01/18/2022   10:54 AM 08/28/2021    4:56 PM 05/17/2020    2:30 PM 07/29/2019    8:49 AM  Advanced Directives  Does Patient Have a Medical Advance Directive? No No No No No No No  Would patient like information on creating a medical advance directive? No - Patient declined No - Patient declined No - Patient declined No - Patient declined No - Patient declined No - Patient declined No - Patient declined    Current Medications (verified) Outpatient Encounter Medications as of 12/02/2022  Medication Sig   amLODipine-benazepril (LOTREL) 10-20 MG capsule Take 1 capsule by mouth daily.   sildenafil (REVATIO) 20 MG tablet Take 1-2 tablets as needed for sexual activity   No facility-administered encounter medications on file as of 12/02/2022.    Allergies (verified) Patient has no known allergies.   History: Past Medical History:  Diagnosis Date   Gout    Hyperlipidemia    Hypertension    Positive PPD    History of   No past surgical history on file. No family history on file. 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 [employer]   Never Smoked   Alcohol use-yes, social   Drug use-no   Social Determinants of Health   Financial Resource Strain: Not on file  Food Insecurity: No Food Insecurity (12/02/2022)   Hunger Vital Sign    Worried About Running Out of Food in the Last Year: Never true    Ran Out of Food in the Last Year: Never true  Transportation Needs: No Transportation Needs (12/02/2022)   PRAPARE - Hydrologist (Medical): No    Lack of Transportation (Non-Medical): No  Physical Activity: Inactive (12/02/2022)   Exercise Vital Sign    Days of Exercise per Week: 0 days    Minutes of Exercise per Session: 0 min  Stress: Not on file  Social Connections: Not on file    Tobacco Counseling Counseling given: Not Answered   Clinical Intake:  Pre-visit preparation completed: Yes  Pain : No/denies pain        How often do  you need to have someone help you when you read instructions, pamphlets, or other written materials from your doctor or pharmacy?: 4 - Often 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 0 0  Vision? 1 1  Difficulty concentrating or making decisions? 0 0  Walking or climbing stairs? 0 0  Dressing or bathing? 0 0  Doing errands, shopping? 0 0    Patient Care Team: Serita Butcher, MD as PCP - General  Indicate any recent Medical Services you may have received from other than [facility] providers in the past year (date may be approximate).     Assessment:   This is a routine wellness examination for  Austin Huerta.  Hearing/Vision screen No results found.  Dietary issues and exercise activities discussed:     Goals Addressed   None   Depression Screen    12/02/2022    5:16 PM 12/02/2022    5:06 PM 01/18/2022   10:51 AM 08/28/2021    4:55 PM 05/17/2020    2:42 PM 07/29/2019    8:48 AM 05/03/2019    3:13 PM  PHQ 2/9 Scores  PHQ - 2 Score 0 0 0 0 0 0 0  PHQ- 9 Score    0  0     Fall Risk    12/02/2022    5:15 PM 12/02/2022    5:06 PM 01/18/2022   10:48 AM 08/28/2021    3:01 PM 05/17/2020    2:30 PM  [place] in the past year? 0 0 0 0 0  Number falls in past yr: 0 0 0 0   Injury with Fall? 0 0 0 0   Risk for fall due to : Impaired vision Impaired vision History of fall(s);No Fall Risks No Fall Risks No Fall Risks  Follow up Falls evaluation completed Falls evaluation completed Falls evaluation completed;Falls prevention discussed Falls evaluation completed     FALL RISK PREVENTION PERTAINING TO THE HOME:  Any stairs in or around the home? Yes  If so, are there any without handrails? Yes  Home free of loose throw rugs in walkways, pet beds, electrical cords, etc? No  Adequate lighting in your home to reduce risk of falls? No   ASSISTIVE DEVICES UTILIZED TO PREVENT FALLS:  Life alert? No  Use of a cane, walker or w/c? No  Grab bars in the bathroom? No  Shower chair or bench in shower? No  Elevated toilet seat or a handicapped toilet? No   TIMED UP AND GO:  Was the test performed? No .  Length of time to ambulate 10 feet: 0 sec.   Gait slow and steady without use of assistive device  Cognitive Function:        12/02/2022    5:15 PM  6CIT Screen  What Year? 0 points  What month? 0 points  What time? 0 points  Count back from 20 0 points  Months in reverse 0 points  Repeat phrase 0 points  Total Score 0 points    Immunizations Immunization History  Administered Date(s) Administered   Fluad Quad(high Dose 65+) 08/28/2021   Influenza Split 07/20/2015    Influenza,inj,Quad PF,6+ Mos  06/22/2019, 06/21/2020   Tdap 11/01/2010    TDAP status: Due, Education has been provided regarding the importance of this vaccine. Advised may receive this vaccine at local pharmacy or Health Dept. Aware to provide a copy of the vaccination record if obtained from local pharmacy or Health Dept. Verbalized acceptance and understanding.  Flu Vaccine status: Up to date-pt reported flu vaccine at [facility] (not confirmed)  Pneumococcal vaccine status: Due, Education has been provided regarding the importance of this vaccine. Advised may receive this vaccine at local pharmacy or Health Dept. Aware to provide a copy of the vaccination record if obtained from local pharmacy or Health Dept. Verbalized acceptance and understanding.  Covid-19 vaccine status: Information provided on how to obtain vaccines.   Qualifies for Shingles Vaccine? Yes   Zostavax completed No   Shingrix Completed?: No.    Education has been provided regarding the importance of this vaccine. Patient has been advised to call insurance company to determine out of pocket expense if they have not yet received this vaccine. Advised may also receive vaccine at local pharmacy or Health Dept. Verbalized acceptance and understanding.  Screening Tests Health Maintenance  Topic Date Due   COVID-19 Vaccine (1) Never done   Zoster Vaccines- Shingrix (1 of 2) Never done   Pneumonia Vaccine 74+ Years old (1 of 1 - PCV) Never done   DTaP/Tdap/Td (2 - Td or Tdap) 11/01/2020   INFLUENZA VACCINE  04/23/2022   Medicare Annual Wellness (AWV)  12/02/2023   HPV VACCINES  Aged Out    Health Maintenance  Health Maintenance Due  Topic Date Due   COVID-19 Vaccine (1) Never done   Zoster Vaccines- Shingrix (1 of 2) Never done   Pneumonia Vaccine 84+ Years old (1 of 1 - PCV) Never done   DTaP/Tdap/Td (2 - Td or Tdap) 11/01/2020   INFLUENZA VACCINE  04/23/2022    Colorectal cancer screening: No longer  required.   Lung Cancer Screening: (Low Dose CT Chest recommended if Age 55-80 years, 30 pack-year currently smoking OR have quit w/in 15years.) does not qualify.   Lung Cancer Screening Referral: n/a  Additional Screening:  Hepatitis C Screening: does qualify; Completed will defer to pcp  Vision Screening: Recommended annual ophthalmology exams for early detection of glaucoma and other disorders of the eye. Is the patient up to date with their annual eye exam?  Yes  Who is the provider or what is the name of the office in which the patient attends annual eye exams? [facility] If pt is not established with a provider, would they like to be referred to a provider to establish care?  N/a .   Dental Screening: Recommended annual dental exams for proper oral hygiene  Community Resource Referral / Chronic Care Management: CRR required this visit?  No   CCM required this visit?   Pt met with Milus Height at today's visit      Plan:     I have personally reviewed and noted the following in the patient's chart:   Medical and social history Use of alcohol, tobacco or illicit drugs  Current medications and supplements including opioid prescriptions. Patient is not currently taking opioid prescriptions. Functional ability and status Nutritional status Physical activity Advanced directives List of other physicians Hospitalizations, surgeries, and ER visits in previous 12 months Vitals Screenings to include cognitive, depression, and falls Referrals and appointments  In addition, I have reviewed and discussed with patient certain preventive protocols, quality metrics, and  best practice recommendations. A written personalized care plan for preventive services as well as general preventive health recommendations were provided to patient.     Nicoletta Dress, [place]   12/02/2022   Nurse Notes: face to face  Austin Huerta , Thank you for taking time to come for your Medicare  Wellness Visit. I appreciate your ongoing commitment to your health goals. Please review the following plan we discussed and let me know if I can assist you in the future.   These are the goals we discussed:  Goals      Blood Pressure < 140/90        This is a list of the screening recommended for you and due dates:  Health Maintenance  Topic Date Due   COVID-19 Vaccine (1) Never done   Zoster (Shingles) Vaccine (1 of 2) Never done   Pneumonia Vaccine (1 of 1 - PCV) Never done   DTaP/Tdap/Td vaccine (2 - Td or Tdap) 11/01/2020   Flu Shot  04/23/2022   Medicare Annual Wellness Visit  12/02/2023   HPV Vaccine  Aged Out

## 2022-12-02 NOTE — Assessment & Plan Note (Addendum)
Patient has history of hypertension managed with amlodipine-benazepril. Today in clinic, his BP was 156/80 and repeat 157/78. Although he took his medication this morning, he reports inconsistent adherence on average. He will take his medication about 5-6 times per week, in part due to financial stresses. Most recent BMP from 08-2021 revealed GFR 54, consistent with CKD IIIa. Electrolytes all within the normal range. Repeat BMP is warranted, and patient was encouraged to take his BP medication consistently. We also discussed the importance of a low-salt diet.  - Continue amlodipine-benazepril 10-'20mg'$  q24

## 2022-12-02 NOTE — Patient Instructions (Signed)
  Thank you, Austin Huerta, for allowing [place] to provide your care today. Today we discussed . . .  > Hypertension        - your blood pressure was high today       - please take your blood pressure medications every day without skipping any doses        - try to avoid salt with your meals    I have ordered the following labs for you:   Lab Orders         BMP8+Anion Gap       Tests ordered today:  none   Referrals ordered today:   Referral Orders  No referral(s) requested today      I have ordered the following medication/changed the following medications:   Stop the following medications: There are no discontinued medications.   Start the following medications: No orders of the defined types were placed in this encounter.     Follow up: 6 months    Remember:  Please continue to take your blood pressure medication and try your best to avoid missing any doses. We will see you again in about six months!   Should you have any questions or concerns please call the internal medicine clinic at [phone].     Roswell Nickel, MD [place]

## 2022-12-02 NOTE — Patient Instructions (Signed)
Health Maintenance, Male Adopting a healthy lifestyle and getting preventive care are important in promoting health and wellness. Ask your health care provider about: The right schedule for you to have regular tests and exams. Things you can do on your own to prevent diseases and keep yourself healthy. What should I know about diet, weight, and exercise? Eat a healthy diet  Eat a diet that includes plenty of vegetables, fruits, low-fat dairy products, and lean protein. Do not eat a lot of foods that are high in solid fats, added sugars, or sodium. Maintain a healthy weight Body mass index (BMI) is a measurement that can be used to identify possible weight problems. It estimates body fat based on height and weight. Your health care provider can help determine your BMI and help you achieve or maintain a healthy weight. Get regular exercise Get regular exercise. This is one of the most important things you can do for your health. Most adults should: Exercise for at least 150 minutes each week. The exercise should increase your heart rate and make you sweat (moderate-intensity exercise). Do strengthening exercises at least twice a week. This is in addition to the moderate-intensity exercise. Spend less time sitting. Even light physical activity can be beneficial. Watch cholesterol and blood lipids Have your blood tested for lipids and cholesterol at 82 years of age, then have this test every 5 years. You may need to have your cholesterol levels checked more often if: Your lipid or cholesterol levels are high. You are older than 82 years of age. You are at high risk for heart disease. What should I know about cancer screening? Many types of cancers can be detected early and may often be prevented. Depending on your health history and family history, you may need to have cancer screening at various ages. This may include screening for: Colorectal cancer. Prostate cancer. Skin cancer. Lung  cancer. What should I know about heart disease, diabetes, and high blood pressure? Blood pressure and heart disease High blood pressure causes heart disease and increases the risk of stroke. This is more likely to develop in people who have high blood pressure readings or are overweight. Talk with your health care provider about your target blood pressure readings. Have your blood pressure checked: Every 3-5 years if you are 18-39 years of age. Every year if you are 40 years old or older. If you are between the ages of 65 and 75 and are a current or former smoker, ask your health care provider if you should have a one-time screening for abdominal aortic aneurysm (AAA). Diabetes Have regular diabetes screenings. This checks your fasting blood sugar level. Have the screening done: Once every three years after age 45 if you are at a normal weight and have a low risk for diabetes. More often and at a younger age if you are overweight or have a high risk for diabetes. What should I know about preventing infection? Hepatitis B If you have a higher risk for hepatitis B, you should be screened for this virus. Talk with your health care provider to find out if you are at risk for hepatitis B infection. Hepatitis C Blood testing is recommended for: Everyone born from 1945 through 1965. Anyone with known risk factors for hepatitis C. Sexually transmitted infections (STIs) You should be screened each year for STIs, including gonorrhea and chlamydia, if: You are sexually active and are younger than 82 years of age. You are older than 82 years of age and your   health care provider tells you that you are at risk for this type of infection. Your sexual activity has changed since you were last screened, and you are at increased risk for chlamydia or gonorrhea. Ask your health care provider if you are at risk. Ask your health care provider about whether you are at high risk for HIV. Your health care provider  may recommend a prescription medicine to help prevent HIV infection. If you choose to take medicine to prevent HIV, you should first get tested for HIV. You should then be tested every 3 months for as long as you are taking the medicine. Follow these instructions at home: Alcohol use Do not drink alcohol if your health care provider tells you not to drink. If you drink alcohol: Limit how much you have to 0-2 drinks a day. Know how much alcohol is in your drink. In the [place] one drink equals one 12 oz bottle of beer (355 mL), one 5 oz glass of wine (148 mL), or one 1 oz glass of hard liquor (44 mL). Lifestyle Do not use any products that contain nicotine or tobacco. These products include cigarettes, chewing tobacco, and vaping devices, such as e-cigarettes. If you need help quitting, ask your health care provider. Do not use street drugs. Do not share needles. Ask your health care provider for help if you need support or information about quitting drugs. General instructions Schedule regular health, dental, and eye exams. Stay current with your vaccines. Tell your health care provider if: You often feel depressed. You have ever been abused or do not feel safe at home. Summary Adopting a healthy lifestyle and getting preventive care are important in promoting health and wellness. Follow your health care provider's instructions about healthy diet, exercising, and getting tested or screened for diseases. Follow your health care provider's instructions on monitoring your cholesterol and blood pressure. This information is not intended to replace advice given to you by your health care provider. Make sure you discuss any questions you have with your health care provider. Document Revised: 01/29/2021 Document Reviewed: 01/29/2021 Elsevier Patient Education  2023 Elsevier Inc.  

## 2022-12-03 LAB — BMP8+ANION GAP
Anion Gap: 12 mmol/L (ref 10.0–18.0)
BUN/Creatinine Ratio: 6 — ABNORMAL LOW (ref 10–24)
BUN: 5 mg/dL — ABNORMAL LOW (ref 8–27)
CO2: 25 mmol/L (ref 20–29)
Calcium: 8.9 mg/dL (ref 8.6–10.2)
Chloride: 104 mmol/L (ref 96–106)
Creatinine, Ser: 0.88 mg/dL (ref 0.76–1.27)
Glucose: 94 mg/dL (ref 70–99)
Potassium: 4.7 mmol/L (ref 3.5–5.2)
Sodium: 141 mmol/L (ref 134–144)
eGFR: 86 mL/min/{1.73_m2} (ref >59)

## 2022-12-03 NOTE — Progress Notes (Signed)
Internal Medicine Clinic Attending  Case discussed with Dr. Harper  At the time of the visit.  We reviewed the resident's history and exam and pertinent patient test results.  I agree with the assessment, diagnosis, and plan of care documented in the resident's note.  

## 2022-12-03 NOTE — BH Specialist Note (Signed)
Integrated Behavioral Health Initial In-Person Visit  MRN: [id number] Name: Austin Huerta  Number of [place] Clinician visits: 1- Initial Visit  Session Start time: [id number]    Session End time: [id number]  Total time in minutes: 36   Types of Service: Introduction only  Interpretor:No. Interpretor Name and Language: N/A   Warm Hand Off Completed.        Subjective: Austin Huerta is a 82 y.o. male  Patient was referred by PCP    Objective: Mood: Anxious and Affect: Appropriate Risk of harm to self or others: No plan to harm self or others   Assessment: [facility] introduced self to patient and gave patient contact information. Patient and [facility] discussed [employer] as patient utilities are disconnected. [facility] has no resolutions for situation due to patient declining DSS services and refusing to pay monies to [employer]. 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 APS has been involved in the past and became patients payee. Patient appealed the state  becoming his payee and now he is back in charge of paying his expenses.   [facility] did budget counseling with patient. Patient receives $1500 monthly with a $400 rental expense. Patient complained of buying restaurant food due to no working utilities. [facility] gave patient resources for places serving hot meals. Patient was also familiar with several places. [facility] gave patient food items from pantry and bus passes.   Patient advised he would contact clinic to schedule a follow up with [facility].      Plan: Follow up with behavioral health clinician on : Patient will contact agency to schedule appointment when ready.  Milus Height, MSW, [place]  [facility] Direct Dial:[phone]  Fax [phone] Main Office Phone: [phone] [address] [place], [place] [address] Website: [place], [place]

## 2022-12-10 ENCOUNTER — Other Ambulatory Visit: Payer: Self-pay | Admitting: Student

## 2022-12-10 DIAGNOSIS — I10 Essential (primary) hypertension: Secondary | ICD-10-CM

## 2022-12-10 DIAGNOSIS — N528 Other male erectile dysfunction: Secondary | ICD-10-CM

## 2022-12-10 MED ORDER — AMLODIPINE BESY-BENAZEPRIL HCL 10-20 MG PO CAPS: 1.0000 | ORAL_CAPSULE | Freq: Every day | ORAL | 3 refills | Status: DC

## 2022-12-10 MED ORDER — SILDENAFIL CITRATE 20 MG PO TABS: ORAL_TABLET | ORAL | 0 refills | Status: AC

## 2022-12-25 NOTE — Progress Notes (Signed)
I reviewed the AWV findings with the provider who conducted the visit. I was present in the office suite and immediately available to provide assistance and direction throughout the time the service was provided.  

## 2023-02-25 ENCOUNTER — Encounter: Payer: Self-pay | Admitting: *Deleted

## 2023-04-14 DIAGNOSIS — Z77011 Contact with and (suspected) exposure to lead: Secondary | ICD-10-CM | POA: Diagnosis not present

## 2023-04-14 DIAGNOSIS — Z79899 Other long term (current) drug therapy: Secondary | ICD-10-CM | POA: Diagnosis not present

## 2023-04-14 DIAGNOSIS — Z1159 Encounter for screening for other viral diseases: Secondary | ICD-10-CM | POA: Diagnosis not present

## 2023-04-14 DIAGNOSIS — H409 Unspecified glaucoma: Secondary | ICD-10-CM | POA: Diagnosis not present

## 2023-04-14 DIAGNOSIS — I10 Essential (primary) hypertension: Secondary | ICD-10-CM | POA: Diagnosis not present

## 2023-04-14 DIAGNOSIS — J309 Allergic rhinitis, unspecified: Secondary | ICD-10-CM | POA: Diagnosis not present

## 2023-04-14 DIAGNOSIS — I8393 Asymptomatic varicose veins of bilateral lower extremities: Secondary | ICD-10-CM | POA: Diagnosis not present

## 2023-04-14 DIAGNOSIS — M238X2 Other internal derangements of left knee: Secondary | ICD-10-CM | POA: Diagnosis not present

## 2023-04-14 DIAGNOSIS — Z0001 Encounter for general adult medical examination with abnormal findings: Secondary | ICD-10-CM | POA: Diagnosis not present

## 2023-04-14 DIAGNOSIS — M238X1 Other internal derangements of right knee: Secondary | ICD-10-CM | POA: Diagnosis not present

## 2023-04-14 DIAGNOSIS — Z136 Encounter for screening for cardiovascular disorders: Secondary | ICD-10-CM | POA: Diagnosis not present

## 2023-08-12 ENCOUNTER — Ambulatory Visit: Payer: Medicare Other

## 2023-08-12 DIAGNOSIS — Z23 Encounter for immunization: Secondary | ICD-10-CM

## 2023-11-26 ENCOUNTER — Ambulatory Visit: Payer: Medicare Other | Admitting: Podiatry

## 2023-12-29 ENCOUNTER — Ambulatory Visit (INDEPENDENT_AMBULATORY_CARE_PROVIDER_SITE_OTHER): Admitting: Licensed Clinical Social Worker

## 2023-12-29 ENCOUNTER — Telehealth: Payer: Self-pay | Admitting: *Deleted

## 2023-12-29 ENCOUNTER — Telehealth (INDEPENDENT_AMBULATORY_CARE_PROVIDER_SITE_OTHER): Admitting: Licensed Clinical Social Worker

## 2023-12-29 DIAGNOSIS — Z59819 Housing instability, housed unspecified: Secondary | ICD-10-CM

## 2023-12-29 NOTE — Telephone Encounter (Signed)
 Spoke with patient states has been without power since August of 2023.  Patient states unable to pay bill.  Has to pay over $1000 to get his power back on.  Patient states has a Caregiver to take him to [employer] to get his food .  He does not go [place].  Also states that his refrigerator has developed mold and he does not open it.  Requesting a letter from the doctor stating that he needs Power due to his health issues.  Patient lives in an apartment -housing is not a problem for him.  When asked why he has not mentioned this is past appointments.  States that he did and some lady brought him a bag of food. Will plan and informed patient to see if their could be a SW involved so that he can get his Power turned back on.  States that he plans to send the letter to [employer] in [place] to ask if he can make payments as he has money coming in.  States has cleared problem with bank and folks are unable to take his money from his bank account.

## 2023-12-29 NOTE — Telephone Encounter (Addendum)
 [facility] attempted patient on today. Patients Vm is full and unable to leave a VM. [facility] received a message patient is inquiring on Utility assistance. Please refer to encounter on 10/30/2022.    Patient has been disconnected since Aug 2023 and  therefore he wouldn't meet the requirements for the Moratorium which helps prevent disconnection of service with [employer]. His DSS Case Worker- Lauren Feliz Beam,  explained to him the eligibility requirements for the Moratorium established by the [employer].   Patient needs to contact his DSS worker for utility assistance-   Balinda Quails Lead Eligibility Caseworker Social Services [facility] [address], [place], [place] [address] [phone] [email or web address] .[phone]

## 2023-12-29 NOTE — BH Specialist Note (Addendum)
 [facility] received message from Nurse Triage of patient call on today regarding utility assistance.   As courtesy and to get patient immediate assistance, [facility] requested front staff to place patient on [facility] schedule  for the last slot of today and [facility] immediately attempted patient. [facility] unsuccessfully attempted patient on today . Patients Vm is full and unable to leave a VM.    Patient has been disconnected since Aug 2023 and  therefore he wouldn't meet the requirements for the Moratorium which helps prevent disconnection of service with [employer]. His DSS Case Worker- Lauren Feliz Beam,  explained to him the eligibility requirements for the Moratorium established by the [employer]. Please refer to encounter on 10/30/2022   Patient needs to contact his DSS worker for utility assistance- LIEAP and CIP Patient is inelgible for .    Balinda Quails Lead Eligibility Caseworker Social Services [facility] [address], [place], [place] [address] [phone] [email or web address]   Christen Butter, MSW, LCSW-A She/Her Behavioral Health Clinician [facility]  [facility] Direct Dial:[phone]  Fax [phone] Main Office Phone: [phone] [address] [place] [place], [place] [address] Website: [facility] Internal [facility]  [place], [place]  [place]

## 2023-12-30 ENCOUNTER — Telehealth: Payer: Self-pay | Admitting: Student

## 2023-12-30 NOTE — Telephone Encounter (Signed)
 Please see message below.  Pt is requesting to sch his AWV.    Copied from CRM [id number]. Topic: Appointments - Scheduling Inquiry for Clinic >> Dec 26, 2023 12:08 PM Adrianna P wrote: Reason for CRM: Patient called to schedule awv/ physical was not able to do it from my end please reach out to patient. He prefers early afternoon appt. Has concerns about mold/mildew

## 2024-01-14 ENCOUNTER — Ambulatory Visit: Admitting: Student

## 2024-01-14 ENCOUNTER — Encounter: Payer: Self-pay | Admitting: Student

## 2024-01-14 VITALS — BP 152/76 | HR 67 | Temp 98.2°F | Ht 68.0 in | Wt 195.3 lb

## 2024-01-14 DIAGNOSIS — H6122 Impacted cerumen, left ear: Secondary | ICD-10-CM | POA: Diagnosis not present

## 2024-01-14 DIAGNOSIS — I10 Essential (primary) hypertension: Secondary | ICD-10-CM

## 2024-01-14 DIAGNOSIS — K439 Ventral hernia without obstruction or gangrene: Secondary | ICD-10-CM | POA: Diagnosis not present

## 2024-01-14 DIAGNOSIS — H548 Legal blindness, as defined in USA: Secondary | ICD-10-CM | POA: Diagnosis not present

## 2024-01-14 DIAGNOSIS — Z139 Encounter for screening, unspecified: Secondary | ICD-10-CM | POA: Insufficient documentation

## 2024-01-14 DIAGNOSIS — K469 Unspecified abdominal hernia without obstruction or gangrene: Secondary | ICD-10-CM | POA: Insufficient documentation

## 2024-01-14 MED ORDER — AMLODIPINE BESY-BENAZEPRIL HCL 10-20 MG PO CAPS: 1.0000 | ORAL_CAPSULE | Freq: Every day | ORAL | 3 refills | Status: AC

## 2024-01-14 NOTE — Progress Notes (Signed)
 Subjective:  CC: follow up regarding abdominal bulge  HPI:  [name] is a 83 y.o. male with a past medical history stated below and presents today for multiple needs. Most pressing ones today is decreased hearing in his L ear, which he suspects is from impacted cerumen, and a reducible L abdominal bulge he noticed 2 months ago. Of note, patient is housing unstable and has been living without power. There is extensive documentation in the chart about this, and unfortunately, there is limited help from our office. He has been routed to his DSS case worker to help with this further.  Patient did not take medications this AM. We have scheduled a 01/23/2024 appointment with me to discuss chronic health care conditions at that time. However, since he has been on ACEi without BMP in months, will obtain today.   Please see problem based assessment and plan for additional details.  Past Medical History:  Diagnosis Date   Gout    Hyperlipidemia    Hypertension    Positive PPD    History of    Current Outpatient Medications on File Prior to Visit  Medication Sig Dispense Refill   sildenafil  (REVATIO ) 20 MG tablet Take 1-2 tablets as needed for sexual activity 20 tablet 0   No current facility-administered medications on file prior to visit.    No family history on file.  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  [employer]   Never Smoked   Alcohol use-yes, social   Drug use-no   Social Drivers of Corporate investment banker Strain: Not on file  Food Insecurity: No Food Insecurity (12/02/2022)   Hunger Vital Sign    Worried About Running Out of Food in the Last Year: Never true    Ran Out of Food in the Last Year: Never true  Transportation Needs: No Transportation Needs (12/02/2022)   PRAPARE - Administrator, Civil Service (Medical): No    Lack of Transportation (Non-Medical): No  Physical Activity: Inactive (12/02/2022)   Exercise Vital Sign    Days of Exercise per Week: 0 days    Minutes of Exercise per Session: 0 min  Stress: Not on file  Social Connections: Not on file  Intimate Partner Violence: Not At Risk (12/02/2022)   Humiliation, Afraid, Rape, and Kick questionnaire    Fear of Current or Ex-Partner: No    Emotionally Abused: No    Physically Abused: No    Sexually Abused: No    Review of Systems: ROS negative except for what is noted on the assessment and plan.  Objective:   Vitals:   01/14/24 1053  BP: (!) 152/76  Pulse: 67  Temp: 98.2 F (36.8 C)  TempSrc: Oral  SpO2: 99%  Weight: 195 lb  4.8 oz (88.6 kg)  Height: 5\' 8"  (1.727 m)    Physical Exam: Constitutional: well-appearing elderly man in no acute distress Eyes: conjunctiva non-erythematous Cardiovascular: regular rate and rhythm, no m/r/g Pulmonary/Chest: normal work of breathing on room air, lungs clear to auscultation bilaterally Abdominal: soft, non-tender, non-distended. Normal bowel sounds MSK: normal bulk and tone Neurological: alert & oriented x 3, 5/5 strength in bilateral upper and lower extremities, normal gait Skin: warm and dry Psych: Pleasant mood and affect    Assessment & Plan:   Abdominal hernia Patient described a bulge in his R abdomen that decreases in size with recumbency or when he pushes on it. Cannot feel it all the time; mostly present when he strains with  bowel movement. No acute pain, or difficulty urinating. Mild constipation from time to time. When he points at it, it seems the hernia is on the anterior, R edge of the arcuate line. It is about ~2cm  Not able to palpate the edges of the abdominal defect today. Normal bowel sounds and non tender abdomen today. Attempted cough and straining impulses today without clear view of the bulge. No changes in the skin.  Discussed that this is consistent with a hernia. Discussed trying to assess this further with an ultrasound/ CT scan, but patient politely declined. In that case, we discussed supportive management with miralax and senna PRN to prevent straining with bowel movements. Precautionary symptoms to seek prompt medical attention reviewed.   Essential hypertension BMP today - Will discuss further therapy in one week follow up  Legally blind Patient to follow up with Dr. Candi Huerta for glaucoma. Dense cataracts today. Unable to read printed paper. Wrote instructions in his notebook  with 3-inch sized letters to be able to read.  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 - Will continue to monitor  Encounter for screening involving social determinants of health [facility]) Per Austin Huerta "Patient has been disconnected since Aug 2023 and  therefore he wouldn't meet the requirements for the Moratorium which helps prevent disconnection of service with [employer]. His DSS Case Worker- Austin Huerta,  explained to him the eligibility requirements for the Moratorium established by the [place]  [employer]. Please refer to encounter on 10/30/2022 "  Patient was informed of the limitations of our clinic to further help with his oustanding bill. He is currently working with Chiropractor; provided their number. It seems they are working of finding different types of funding to help  with ~1K outstanding bill.   Patient reports having food/buying it from cafeteria. Politely declines canned food from our pantry.   Patient discussed with Dr. Macario Savin, MD [facility] Internal Medicine Residency Program  01/14/2024, 2:06 PM

## 2024-01-14 NOTE — Assessment & Plan Note (Signed)
 BMP today - Will discuss further therapy in one week follow up

## 2024-01-14 NOTE — Assessment & Plan Note (Signed)
 Per Amie Bald "Patient has been disconnected since Aug 2023 and  therefore he wouldn't meet the requirements for the Moratorium which helps prevent disconnection of service with [employer]. His DSS Case Worker- Emmitt Harp,  explained to him the eligibility requirements for the Moratorium established by the [employer]. Please refer to encounter on 10/30/2022 "  Patient was informed of the limitations of our clinic to further help with his oustanding bill. He is currently working with Chiropractor; provided their number. It seems they are working of finding different types of funding to help with ~1K outstanding bill.   Patient reports having food/buying it from cafeteria. Politely declines canned food from our pantry.

## 2024-01-14 NOTE — Assessment & Plan Note (Signed)
 Patient described a bulge in his R abdomen that decreases in size with recumbency or when he pushes on it. Cannot feel it all the time; mostly present when he strains with bowel movement. No acute pain, or difficulty urinating. Mild constipation from time to time. When he points at it, it seems the hernia is on the anterior, R edge of the arcuate line. It is about ~2cm  Not able to palpate the edges of the abdominal defect today. Normal bowel sounds and non tender abdomen today. Attempted cough and straining impulses today without clear view of the bulge. No changes in the skin.  Discussed that this is consistent with a hernia. Discussed trying to assess this further with an ultrasound/ CT scan, but patient politely declined. In that case, we discussed supportive management with miralax and senna PRN to prevent straining with bowel movements. Precautionary symptoms to seek prompt medical attention reviewed.

## 2024-01-14 NOTE — Assessment & Plan Note (Signed)
 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 - Will continue to monitor

## 2024-01-14 NOTE — Patient Instructions (Addendum)
 Baldwin Bones Lead Eligibility Caseworker [phone]  COME BACK IN ONE WEEK FOR TO ADDRESS YOUR MEDICAL CONDITIONS  MIRALAX - ONE SCOOP IN WATER DAILY  DRINK PLENTY OF WATER  SENNA - 1 TABLET DAILY

## 2024-01-14 NOTE — Assessment & Plan Note (Signed)
 Patient to follow up with Dr. Candi Chafe for glaucoma. Dense cataracts today. Unable to read printed paper. Wrote instructions in his notebook  with 3-inch sized letters to be able to read.

## 2024-01-15 LAB — BMP8+ANION GAP
Anion Gap: 15 mmol/L (ref 10.0–18.0)
BUN/Creatinine Ratio: 16 (ref 10–24)
BUN: 13 mg/dL (ref 8–27)
CO2: 25 mmol/L (ref 20–29)
Calcium: 9.2 mg/dL (ref 8.6–10.2)
Chloride: 102 mmol/L (ref 96–106)
Creatinine, Ser: 0.82 mg/dL (ref 0.76–1.27)
Glucose: 94 mg/dL (ref 70–99)
Potassium: 4.2 mmol/L (ref 3.5–5.2)
Sodium: 142 mmol/L (ref 134–144)
eGFR: 88 mL/min/{1.73_m2} (ref >59)

## 2024-01-16 NOTE — Progress Notes (Signed)
 Discussed. No changes to current medication. We will check BP at next OV in a week.

## 2024-01-19 ENCOUNTER — Telehealth: Payer: Self-pay | Admitting: Student

## 2024-01-19 NOTE — Telephone Encounter (Signed)
 Copied from CRM [id number]. Topic: Clinical - Medical Advice >> Jan 19, 2024 11:57 AM Hamdi H wrote: Reason for CRM: Patient was seen in the clinic on 4/23, the provider told him about a laxative he should take, the patient wants to know the name brand of the laxative he needs to take. He wants a call back no voicemail he wants us  to keep calling until he answers.

## 2024-01-19 NOTE — Telephone Encounter (Signed)
 I called pt - no answer; mailbox is full; I did not leave a message. Per OV  note "we discussed supportive management with miralax and senna PRN to prevent straining with bowel movements." I will call pt again tomorrow.

## 2024-01-20 NOTE — Telephone Encounter (Signed)
 Called pt - instructed to take Miralax and Senna as needed per Dr Garald Jumbo.

## 2024-01-23 ENCOUNTER — Ambulatory Visit: Admitting: Student

## 2024-01-23 ENCOUNTER — Encounter: Payer: Self-pay | Admitting: Student

## 2024-01-23 VITALS — BP 153/86 | HR 63 | Temp 98.3°F | Ht 68.0 in | Wt 196.9 lb

## 2024-01-23 DIAGNOSIS — M10471 Other secondary gout, right ankle and foot: Secondary | ICD-10-CM | POA: Diagnosis not present

## 2024-01-23 DIAGNOSIS — I10 Essential (primary) hypertension: Secondary | ICD-10-CM

## 2024-01-23 NOTE — Progress Notes (Signed)
 Subjective:  CC: blood pressure follow up and gout  HPI:  [name] is a 83 y.o. male with a past medical history stated below and presents today for blood pressure follow up and gout. Please see problem based assessment and plan for additional details.  Past Medical History:  Diagnosis Date   Gout    Hyperlipidemia    Hypertension    Positive PPD    History of    Current Outpatient Medications on File Prior to Visit  Medication Sig Dispense Refill   amLODipine -benazepril  (LOTREL) 10-20 MG capsule Take 1 capsule by mouth daily. 90 capsule 3   sildenafil  (REVATIO ) 20 MG tablet Take 1-2 tablets as needed for sexual activity 20 tablet 0   No current facility-administered medications on file prior to visit.    No family history on file.  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 [employer]   Never Smoked   Alcohol use-yes, social   Drug use-no   Social Drivers of Corporate investment banker Strain: Not on file  Food Insecurity: No Food Insecurity (12/02/2022)   Hunger Vital Sign    Worried About Running Out of Food in the Last Year: Never true    Ran Out of Food in the Last Year: Never true  Transportation Needs: No Transportation Needs (12/02/2022)   PRAPARE - Administrator, Civil Service (Medical): No    Lack of Transportation (Non-Medical): No  Physical Activity: Inactive  (12/02/2022)   Exercise Vital Sign    Days of Exercise per Week: 0 days    Minutes of Exercise per Session: 0 min  Stress: Not on file  Social Connections: Not on file  Intimate Partner Violence: Not At Risk (12/02/2022)   Humiliation, Afraid, Rape, and Kick questionnaire    Fear of Current or Ex-Partner: No    Emotionally Abused: No    Physically Abused: No    Sexually Abused: No    Review of Systems: ROS negative except for what is noted on the assessment and plan.  Objective:   Vitals:   01/23/24 1043  BP: (!) 153/86  Pulse: 63  Temp: 98.3 F (36.8 C)  TempSrc: Oral  SpO2: 98%  Weight: 196 lb 14.4 oz (89.3 kg)  Height: 5\' 8"  (1.727 m)    Physical Exam: Constitutional: well-appearing elderly male sitting in chair, in no acute distress HENT: normocephalic atraumatic, mucous membranes moist Eyes: conjunctiva non-erythematous Cardiovascular: regular rate and rhythm, no m/r/g Pulmonary/Chest: normal work of breathing on room air, lungs clear to auscultation bilaterally Abdominal: soft, non-tender, non-distended MSK: normal bulk and tone. Tenderness over the R MTP without erythema,  Neurological: alert & oriented x 3, 5/5 strength in bilateral upper and lower extremities, normal gait Skin: warm and dry Psych:  Pleasant mood and affect     Assessment & Plan:   Essential hypertension Uncontrolled today. Patient did not take his medications this morning but reports has been adherent to them. I reviewed his bmp from two weeks ago and it did not show abnormalities. Advised patient to take medications before appointments to assess their effectiveness and need to increase dosing. - Continue Lotrel 10-20 mg  - Return in 4 weeks   Gout Patient with history of gout usually  in the R MTP and R knee. He has not had recurrent in years until yesterday when ate shellfish, which he forgot usually trigger attacks. This time, the pain started in his R MTP. On examination, there is no  edema, induration, or effusion. There is mild synovitis along the medial margin of the R MTP.   Patient has tried ibuprofen 400 mg q6 hours with good relief. Advised to continue it for 5 days maximum. If recurrent, may need to start on uric acid lowering medications. But for now, avoidance of triggers    Return in about 4 weeks (around 02/20/2024) for Blood pressure and gout follow up.  Patient discussed with Dr. Brandt Cake, MD [facility] Internal Medicine Residency Program  01/23/2024, 5:33 PM

## 2024-01-23 NOTE — Assessment & Plan Note (Signed)
 Patient with history of gout usually  in the R MTP and R knee. He has not had recurrent in years until yesterday when ate shellfish, which he forgot usually trigger attacks. This time, the pain started in his R MTP. On examination, there is no edema, induration, or effusion. There is mild synovitis along the medial margin of the R MTP.   Patient has tried ibuprofen 400 mg q6 hours with good relief. Advised to continue it for 5 days maximum. If recurrent, may need to start on uric acid lowering medications. But for now, avoidance of triggers

## 2024-01-23 NOTE — Patient Instructions (Signed)
 Ibuprofen for your gout - 400 mg every 6 hours for no more than 5 days

## 2024-01-23 NOTE — Assessment & Plan Note (Signed)
 Uncontrolled today. Patient did not take his medications this morning but reports has been adherent to them. I reviewed his bmp from two weeks ago and it did not show abnormalities. Advised patient to take medications before appointments to assess their effectiveness and need to increase dosing. - Continue Lotrel 10-20 mg  - Return in 4 weeks

## 2024-01-23 NOTE — Progress Notes (Signed)
 Internal Medicine Clinic Attending  Case discussed with the resident at the time of the visit.  We reviewed the resident's history and exam and pertinent patient test results.  I agree with the assessment, diagnosis, and plan of care documented in the resident's note.

## 2024-01-30 NOTE — Progress Notes (Signed)
 Internal Medicine Clinic Attending  Case discussed with the resident at the time of the visit.  We reviewed the resident's history and exam and pertinent patient test results.  I agree with the assessment, diagnosis, and plan of care documented in the resident's note.

## 2024-02-20 ENCOUNTER — Other Ambulatory Visit: Payer: Self-pay

## 2024-02-20 ENCOUNTER — Ambulatory Visit: Admitting: Student

## 2024-02-20 ENCOUNTER — Encounter: Payer: Self-pay | Admitting: Student

## 2024-02-20 VITALS — BP 132/61 | HR 64 | Temp 98.0°F | Ht 68.0 in | Wt 198.0 lb

## 2024-02-20 DIAGNOSIS — I10 Essential (primary) hypertension: Secondary | ICD-10-CM | POA: Diagnosis not present

## 2024-02-20 DIAGNOSIS — K439 Ventral hernia without obstruction or gangrene: Secondary | ICD-10-CM | POA: Diagnosis not present

## 2024-02-20 DIAGNOSIS — M10471 Other secondary gout, right ankle and foot: Secondary | ICD-10-CM

## 2024-02-20 NOTE — Assessment & Plan Note (Signed)
 History of gout in the right MTP and right knee.  He had a recurrence at the beginning the month after eating shellfish which is a known trigger that he had forgotten about.  In the past, he has tried ibuprofen 40 mg every 6 hours with good relief.  He has been counseled on taking this for a maximum of 5 days. Patient encouraged to avoid triggers such as shellfish, red meats, etc.  - Continue to avoid triggers

## 2024-02-20 NOTE — Progress Notes (Signed)
 CC: Hypertension and gout  HPI: Austin Huerta is a 83 y.o. male living with a history stated below and presents today for hypertension and gout. Please see problem based assessment and plan for additional details.  Past Medical History:  Diagnosis Date   Gout    Hyperlipidemia    Hypertension    Positive PPD    History of    Current Outpatient Medications on File Prior to Visit  Medication Sig Dispense Refill   amLODipine -benazepril  (LOTREL) 10-20 MG capsule Take 1 capsule by mouth daily. 90 capsule 3   sildenafil  (REVATIO ) 20 MG tablet Take 1-2 tablets as needed for sexual activity 20 tablet 0   No current facility-administered medications on file prior to visit.    History reviewed. No pertinent family history.  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 [employer]   Never Smoked   Alcohol use-yes, social   Drug use-no   Social Drivers of Corporate investment banker Strain: Not on file  Food Insecurity: No Food Insecurity (12/02/2022)   Hunger Vital Sign    Worried About Running Out of Food in the Last Year: Never true    Ran Out of Food in the Last Year: Never true  Transportation Needs: No Transportation Needs (12/02/2022)   PRAPARE - Administrator, Civil Service (Medical): No    Lack of Transportation (Non-Medical): No  Physical Activity: Inactive (12/02/2022)   Exercise  Vital Sign    Days of Exercise per Week: 0 days    Minutes of Exercise per Session: 0 min  Stress: Not on file  Social Connections: Not on file  Intimate Partner Violence: Not At Risk (12/02/2022)   Humiliation, Afraid, Rape, and Kick questionnaire    Fear of Current or Ex-Partner: No    Emotionally Abused: No    Physically Abused: No    Sexually Abused: No    Review of Systems: ROS negative except for what is noted on the assessment and plan.  Vitals:   02/20/24 1028 02/20/24 1032  BP: (!) 144/67 132/61  Pulse: 74 64  Temp: 98 F (36.7 C)   TempSrc: Oral   SpO2: 96%   Weight: 198 lb (89.8 kg)   Height: 5\' 8"  (1.727 m)    Physical Exam: Constitutional: well-appearing in no acute distress HENT: normocephalic atraumatic, mucous membranes moist Eyes: conjunctiva non-erythematous Neck: supple Cardiovascular: regular rate and rhythm, no m/r/g Pulmonary/Chest: normal work of breathing on room air, lungs clear to auscultation bilaterally Abdominal: soft, non-tender, non-distended MSK: normal bulk and tone Neurological: alert & oriented x 3, 5/5 strength in bilateral upper and lower extremities, normal gait Skin: warm and dry  Assessment & Plan:   Essential hypertension Patient last seen in the clinic on 01/23/2024.  At that  time, his blood pressure was elevated at 153/86.  Today, it is 144/67. Repeat 132/61.  He currently takes amlodipine -benazepril  10-20 mg/day.  He notes good compliance.  Last got a BMP in 12/2023 that was normal.  Unable to measure at home so encouraged to measure.  - Continue amlodipine -benazepril  10-20 mg/day  Gout History of gout in the right MTP and right knee.  He had a recurrence at the beginning the month after eating shellfish which is a known trigger that he had forgotten about.  In the past, he has tried ibuprofen 40 mg every 6 hours with good relief.  He has been counseled on taking this for a maximum of 5 days. Patient encouraged to avoid triggers  such as shellfish, red meats, etc.  - Continue to avoid triggers  Abdominal hernia Noted on last visit. No changes from prior. Declined CT scan at that time but interested today so will send in order for further evaluation.  - F/U CT abdomen pelvis W contrast   Patient discussed with Dr. Cleon Dacosta, MD  [facility], PGY-1 Date 02/20/2024 Time 10:54 AM

## 2024-02-20 NOTE — Assessment & Plan Note (Signed)
 Patient last seen in the clinic on 01/23/2024.  At that time, his blood pressure was elevated at 153/86.  Today, it is 144/67. Repeat 132/61.  He currently takes amlodipine -benazepril  10-20 mg/day.  He notes good compliance.  Last got a BMP in 12/2023 that was normal.  Unable to measure at home so encouraged to measure.  - Continue amlodipine -benazepril  10-20 mg/day

## 2024-02-20 NOTE — Patient Instructions (Signed)
 Thank you so much for coming to the clinic today!   You will receive a call in the next week regarding scheduling the CT scan.  We will see you in three months.   If you have any questions please feel free to the call the clinic at anytime at [phone]. It was a pleasure seeing you!  Best, Dr. Carolee Churchman

## 2024-02-20 NOTE — Assessment & Plan Note (Signed)
 Noted on last visit. No changes from prior. Declined CT scan at that time but interested today so will send in order for further evaluation.  - F/U CT abdomen pelvis W contrast

## 2024-03-04 ENCOUNTER — Telehealth: Payer: Self-pay | Admitting: *Deleted

## 2024-03-04 NOTE — Telephone Encounter (Signed)
 Spoke with patient regarding his CT appointment for 03-16-24 @ [facility] @ 3:00 pm. Patient wanted to change this appointment / phone number was given to patient so he could call their office himself -[phone]. Patient did not want this appointment until July 2025.

## 2024-03-05 NOTE — Addendum Note (Signed)
 Addended by: Tod Forward C on: 03/05/2024 08:50 AM   Modules accepted: Level of Service

## 2024-03-05 NOTE — Progress Notes (Signed)
 Internal Medicine Clinic Attending  Case discussed with the resident at the time of the visit.  We reviewed the resident's history and exam and pertinent patient test results.  I agree with the assessment, diagnosis, and plan of care documented in the resident's note.

## 2024-03-16 ENCOUNTER — Ambulatory Visit (HOSPITAL_COMMUNITY)

## 2024-05-18 ENCOUNTER — Ambulatory Visit (HOSPITAL_COMMUNITY)

## 2024-06-15 ENCOUNTER — Ambulatory Visit (HOSPITAL_COMMUNITY)
Admission: RE | Admit: 2024-06-15 | Discharge: 2024-06-15 | Disposition: A | Discharge status: Home / Self Care | Source: Ambulatory Visit | Attending: Internal Medicine | Admitting: Internal Medicine

## 2024-06-15 DIAGNOSIS — K439 Ventral hernia without obstruction or gangrene: Secondary | ICD-10-CM | POA: Diagnosis present

## 2024-06-15 LAB — POCT I-STAT CREATININE: Creatinine, Ser: 1.1 mg/dL (ref 0.61–1.24)

## 2024-06-15 MED ORDER — IOHEXOL 9 MG/ML PO SOLN
1000.0000 mL | ORAL | Status: AC
  Administered 2024-06-15: 1000 mL via ORAL

## 2024-06-15 MED ORDER — IOHEXOL 300 MG/ML  SOLN
100.0000 mL | Freq: Once | INTRAMUSCULAR | Status: AC | PRN
  Administered 2024-06-15: 100 mL via INTRAVENOUS
